# Patient Record
Sex: Male | Born: 1990
Health system: Southern US, Community
[De-identification: ages and names within clinical notes are randomized; demographics above are authoritative.]

## PROBLEM LIST (undated history)

## (undated) DIAGNOSIS — F32A Depression, unspecified: Secondary | ICD-10-CM

## (undated) DIAGNOSIS — I10 Essential (primary) hypertension: Secondary | ICD-10-CM

## (undated) DIAGNOSIS — F419 Anxiety disorder, unspecified: Secondary | ICD-10-CM

## (undated) DIAGNOSIS — F329 Major depressive disorder, single episode, unspecified: Secondary | ICD-10-CM

## (undated) HISTORY — DX: Depression, unspecified: F32.A

## (undated) HISTORY — DX: Anxiety disorder, unspecified: F41.9

## (undated) HISTORY — DX: Major depressive disorder, single episode, unspecified: F32.9

---
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
## 2016-11-11 DIAGNOSIS — E785 Hyperlipidemia, unspecified: Secondary | ICD-10-CM | POA: Diagnosis not present

## 2016-11-11 DIAGNOSIS — R197 Diarrhea, unspecified: Secondary | ICD-10-CM | POA: Diagnosis not present

## 2016-11-11 DIAGNOSIS — Z23 Encounter for immunization: Secondary | ICD-10-CM | POA: Diagnosis not present

## 2016-11-11 DIAGNOSIS — I10 Essential (primary) hypertension: Secondary | ICD-10-CM | POA: Diagnosis not present

## 2016-11-11 DIAGNOSIS — E039 Hypothyroidism, unspecified: Secondary | ICD-10-CM | POA: Diagnosis not present

## 2016-11-11 DIAGNOSIS — F410 Panic disorder [episodic paroxysmal anxiety] without agoraphobia: Secondary | ICD-10-CM | POA: Diagnosis not present

## 2016-11-11 DIAGNOSIS — Z79899 Other long term (current) drug therapy: Secondary | ICD-10-CM | POA: Diagnosis not present

## 2016-12-02 DIAGNOSIS — R5381 Other malaise: Secondary | ICD-10-CM | POA: Diagnosis not present

## 2016-12-02 DIAGNOSIS — E039 Hypothyroidism, unspecified: Secondary | ICD-10-CM | POA: Diagnosis not present

## 2016-12-02 DIAGNOSIS — R5383 Other fatigue: Secondary | ICD-10-CM | POA: Diagnosis not present

## 2016-12-06 DIAGNOSIS — E039 Hypothyroidism, unspecified: Secondary | ICD-10-CM | POA: Diagnosis not present

## 2016-12-06 DIAGNOSIS — R5381 Other malaise: Secondary | ICD-10-CM | POA: Diagnosis not present

## 2016-12-06 DIAGNOSIS — R0602 Shortness of breath: Secondary | ICD-10-CM | POA: Diagnosis not present

## 2016-12-06 DIAGNOSIS — R5383 Other fatigue: Secondary | ICD-10-CM | POA: Diagnosis not present

## 2016-12-09 DIAGNOSIS — F410 Panic disorder [episodic paroxysmal anxiety] without agoraphobia: Secondary | ICD-10-CM | POA: Diagnosis not present

## 2016-12-09 DIAGNOSIS — F332 Major depressive disorder, recurrent severe without psychotic features: Secondary | ICD-10-CM | POA: Diagnosis not present

## 2017-01-09 DIAGNOSIS — H1032 Unspecified acute conjunctivitis, left eye: Secondary | ICD-10-CM | POA: Diagnosis not present

## 2017-02-05 DIAGNOSIS — R0602 Shortness of breath: Secondary | ICD-10-CM | POA: Diagnosis not present

## 2017-02-05 DIAGNOSIS — F419 Anxiety disorder, unspecified: Secondary | ICD-10-CM | POA: Diagnosis not present

## 2017-02-24 DIAGNOSIS — F410 Panic disorder [episodic paroxysmal anxiety] without agoraphobia: Secondary | ICD-10-CM | POA: Diagnosis not present

## 2017-02-24 DIAGNOSIS — F339 Major depressive disorder, recurrent, unspecified: Secondary | ICD-10-CM | POA: Diagnosis not present

## 2017-02-24 DIAGNOSIS — E039 Hypothyroidism, unspecified: Secondary | ICD-10-CM | POA: Diagnosis not present

## 2017-03-10 DIAGNOSIS — R1110 Vomiting, unspecified: Secondary | ICD-10-CM | POA: Diagnosis not present

## 2017-03-10 DIAGNOSIS — R197 Diarrhea, unspecified: Secondary | ICD-10-CM | POA: Diagnosis not present

## 2017-03-24 DIAGNOSIS — Z1389 Encounter for screening for other disorder: Secondary | ICD-10-CM | POA: Diagnosis not present

## 2017-03-24 DIAGNOSIS — F339 Major depressive disorder, recurrent, unspecified: Secondary | ICD-10-CM | POA: Diagnosis not present

## 2017-03-24 DIAGNOSIS — F410 Panic disorder [episodic paroxysmal anxiety] without agoraphobia: Secondary | ICD-10-CM | POA: Diagnosis not present

## 2017-03-24 DIAGNOSIS — Z79899 Other long term (current) drug therapy: Secondary | ICD-10-CM | POA: Diagnosis not present

## 2017-06-18 DIAGNOSIS — E039 Hypothyroidism, unspecified: Secondary | ICD-10-CM | POA: Diagnosis not present

## 2017-06-18 DIAGNOSIS — E669 Obesity, unspecified: Secondary | ICD-10-CM | POA: Diagnosis not present

## 2017-06-18 DIAGNOSIS — Z0000 Encounter for general adult medical examination without abnormal findings: Secondary | ICD-10-CM | POA: Diagnosis not present

## 2017-06-18 DIAGNOSIS — Z1389 Encounter for screening for other disorder: Secondary | ICD-10-CM | POA: Diagnosis not present

## 2017-08-21 DIAGNOSIS — F332 Major depressive disorder, recurrent severe without psychotic features: Secondary | ICD-10-CM | POA: Diagnosis not present

## 2017-08-28 DIAGNOSIS — F332 Major depressive disorder, recurrent severe without psychotic features: Secondary | ICD-10-CM | POA: Diagnosis not present

## 2017-08-29 DIAGNOSIS — F332 Major depressive disorder, recurrent severe without psychotic features: Secondary | ICD-10-CM | POA: Diagnosis not present

## 2017-09-01 DIAGNOSIS — F332 Major depressive disorder, recurrent severe without psychotic features: Secondary | ICD-10-CM | POA: Diagnosis not present

## 2017-09-02 DIAGNOSIS — F332 Major depressive disorder, recurrent severe without psychotic features: Secondary | ICD-10-CM | POA: Diagnosis not present

## 2017-09-03 DIAGNOSIS — F332 Major depressive disorder, recurrent severe without psychotic features: Secondary | ICD-10-CM | POA: Diagnosis not present

## 2017-09-04 DIAGNOSIS — F332 Major depressive disorder, recurrent severe without psychotic features: Secondary | ICD-10-CM | POA: Diagnosis not present

## 2017-09-05 DIAGNOSIS — F332 Major depressive disorder, recurrent severe without psychotic features: Secondary | ICD-10-CM | POA: Diagnosis not present

## 2017-09-08 DIAGNOSIS — F332 Major depressive disorder, recurrent severe without psychotic features: Secondary | ICD-10-CM | POA: Diagnosis not present

## 2017-09-09 DIAGNOSIS — F332 Major depressive disorder, recurrent severe without psychotic features: Secondary | ICD-10-CM | POA: Diagnosis not present

## 2017-09-10 DIAGNOSIS — F332 Major depressive disorder, recurrent severe without psychotic features: Secondary | ICD-10-CM | POA: Diagnosis not present

## 2017-09-12 DIAGNOSIS — F332 Major depressive disorder, recurrent severe without psychotic features: Secondary | ICD-10-CM | POA: Diagnosis not present

## 2017-09-15 DIAGNOSIS — F332 Major depressive disorder, recurrent severe without psychotic features: Secondary | ICD-10-CM | POA: Diagnosis not present

## 2017-09-16 DIAGNOSIS — F332 Major depressive disorder, recurrent severe without psychotic features: Secondary | ICD-10-CM | POA: Diagnosis not present

## 2017-09-17 DIAGNOSIS — F332 Major depressive disorder, recurrent severe without psychotic features: Secondary | ICD-10-CM | POA: Diagnosis not present

## 2017-09-18 DIAGNOSIS — F332 Major depressive disorder, recurrent severe without psychotic features: Secondary | ICD-10-CM | POA: Diagnosis not present

## 2017-09-19 DIAGNOSIS — F332 Major depressive disorder, recurrent severe without psychotic features: Secondary | ICD-10-CM | POA: Diagnosis not present

## 2017-09-22 DIAGNOSIS — F332 Major depressive disorder, recurrent severe without psychotic features: Secondary | ICD-10-CM | POA: Diagnosis not present

## 2017-09-23 DIAGNOSIS — F332 Major depressive disorder, recurrent severe without psychotic features: Secondary | ICD-10-CM | POA: Diagnosis not present

## 2017-09-24 DIAGNOSIS — F332 Major depressive disorder, recurrent severe without psychotic features: Secondary | ICD-10-CM | POA: Diagnosis not present

## 2017-09-25 DIAGNOSIS — F332 Major depressive disorder, recurrent severe without psychotic features: Secondary | ICD-10-CM | POA: Diagnosis not present

## 2017-09-26 DIAGNOSIS — F332 Major depressive disorder, recurrent severe without psychotic features: Secondary | ICD-10-CM | POA: Diagnosis not present

## 2017-09-30 DIAGNOSIS — F332 Major depressive disorder, recurrent severe without psychotic features: Secondary | ICD-10-CM | POA: Diagnosis not present

## 2017-10-01 DIAGNOSIS — F332 Major depressive disorder, recurrent severe without psychotic features: Secondary | ICD-10-CM | POA: Diagnosis not present

## 2017-10-02 DIAGNOSIS — F332 Major depressive disorder, recurrent severe without psychotic features: Secondary | ICD-10-CM | POA: Diagnosis not present

## 2017-10-04 DIAGNOSIS — F332 Major depressive disorder, recurrent severe without psychotic features: Secondary | ICD-10-CM | POA: Diagnosis not present

## 2017-10-09 DIAGNOSIS — F332 Major depressive disorder, recurrent severe without psychotic features: Secondary | ICD-10-CM | POA: Diagnosis not present

## 2017-12-09 DIAGNOSIS — E039 Hypothyroidism, unspecified: Secondary | ICD-10-CM | POA: Diagnosis not present

## 2017-12-09 DIAGNOSIS — H6120 Impacted cerumen, unspecified ear: Secondary | ICD-10-CM | POA: Diagnosis not present

## 2017-12-09 DIAGNOSIS — H6591 Unspecified nonsuppurative otitis media, right ear: Secondary | ICD-10-CM | POA: Diagnosis not present

## 2017-12-22 DIAGNOSIS — F332 Major depressive disorder, recurrent severe without psychotic features: Secondary | ICD-10-CM | POA: Diagnosis not present

## 2017-12-22 DIAGNOSIS — E039 Hypothyroidism, unspecified: Secondary | ICD-10-CM | POA: Diagnosis not present

## 2017-12-22 DIAGNOSIS — Z1331 Encounter for screening for depression: Secondary | ICD-10-CM | POA: Diagnosis not present

## 2018-12-15 DIAGNOSIS — F329 Major depressive disorder, single episode, unspecified: Secondary | ICD-10-CM | POA: Diagnosis not present

## 2019-01-14 ENCOUNTER — Other Ambulatory Visit: Payer: Self-pay

## 2019-01-14 ENCOUNTER — Encounter: Payer: Self-pay | Admitting: Psychiatry

## 2019-01-14 ENCOUNTER — Ambulatory Visit (INDEPENDENT_AMBULATORY_CARE_PROVIDER_SITE_OTHER): Payer: BLUE CROSS/BLUE SHIELD | Admitting: Psychiatry

## 2019-01-14 DIAGNOSIS — F332 Major depressive disorder, recurrent severe without psychotic features: Secondary | ICD-10-CM

## 2019-01-14 NOTE — Progress Notes (Signed)
ECT: This is a ECT consult for this 28 year old self-referred man.  Patient seen chart reviewed.  No old records available.  Patient reports that he suffers from depression.  Tells me that he has been feeling depressed for many years now.  First got treatment when he was in high school but thinks he was depressed even before that.  Mood feels down and sad all the time.  Difficulty concentrating.  Difficulty functioning at work.  Tired much of the time.  Impaired sleep normal appetite.  Patient says that he feels suicidal all the time but has no formed plan or intention of acting on it.  Denies hallucinations or psychotic symptoms.  Patient is currently not taking any psychiatric medicine.  He recently completed a course of transcranial magnetic stimulation without receiving any benefit in his opinion.  Patient does not drink alcohol or use any drugs.  Lives with his grandparents.  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  Not currently seeing a psychiatrist.  Has been getting treatment through his primary care doctor at times.  Most recent medicine was Viibryd which she did not find effective.  Social history: Patient lives with his grandparents.  Works and goes to [employer].  Not involved in a relationship.  Sounds like he has a pretty lonely life outside of work and school.  Medical history: Hypothyroid.  Currently on 100 mcg of levothyroxine.  Never had general anesthesia.  Substance abuse history: Denies alcohol or drug abuse  Mental status exam: Patient is neatly dressed and groomed.  Cooperative with the interview.  Eye contact normal.  Psychomotor activity normal.  Speech normal rate tone and volume.  Affect is flat and blunted.  No tearfulness.  Mood is stated as depressed.  Thoughts appear to be lucid without any sign of delusional thinking.  Denies hallucinations.  Passive chronic suicidal ideation without intent or plan.  No homicidal ideation.  Patient is alert and  oriented and cognitively intact and able to understand the discussed treatment plan.  Past psychiatric history: No previous psychiatric hospitalization.  He says he has been on at least 11 different antidepressant medicines and never found them to be significantly helpful.  He denies any history consistent with mania.  Denies any psychosis.  Never actually tried to kill himself in the past.  Family history: Positive for substance abuse in his mother  Assessment: 66 year old man with major depression severe recurrent without psychotic features.  He is requesting ECT himself.  Based on diagnosis and history that he is presenting he would be a reasonable candidate.  No contraindications present to prevent ECT.  Patient however is not currently seeing an outpatient provider which is somewhat concerning to me.  Not on any medication.  Does not appear to be acutely dangerous or to require hospitalization.  Spent time explaining and discussing ECT.  Patient was given the opportunity to ask questions as much as he wanted.  He expressed an understanding and states that he would like to proceed with ECT.  We are currently not starting any new treatment until the month of May at least because of the coronavirus situation.  Patient does not want to start until he finishes the college semester after the first week of May anyway.  We are tentatively going to plan for May 8.  Prescription done to get all the basic lab studies done.  I will send an email to nursing and utilization review with a tentative plan for May  8.  Patient states that he will agree to go and try and at least get in to see a therapist sooner rather than later.

## 2019-01-25 ENCOUNTER — Ambulatory Visit: Payer: Self-pay | Admitting: Psychiatry

## 2019-03-24 ENCOUNTER — Ambulatory Visit
Admission: RE | Admit: 2019-03-24 | Discharge: 2019-03-24 | Disposition: A | Discharge status: Home / Self Care | Payer: BC Managed Care – PPO | Source: Ambulatory Visit | Attending: Psychiatry | Admitting: Psychiatry

## 2019-03-24 ENCOUNTER — Other Ambulatory Visit: Payer: Self-pay

## 2019-03-24 ENCOUNTER — Encounter
Admission: RE | Admit: 2019-03-24 | Discharge: 2019-03-24 | Disposition: A | Discharge status: Home / Self Care | Payer: BC Managed Care – PPO | Source: Ambulatory Visit | Attending: Psychiatry | Admitting: Psychiatry

## 2019-03-24 DIAGNOSIS — Z01818 Encounter for other preprocedural examination: Secondary | ICD-10-CM | POA: Insufficient documentation

## 2019-03-24 DIAGNOSIS — Z01810 Encounter for preprocedural cardiovascular examination: Secondary | ICD-10-CM | POA: Diagnosis not present

## 2019-03-24 LAB — BASIC METABOLIC PANEL
Anion gap: 9 (ref 5–15)
BUN: 12 mg/dL (ref 6–20)
CO2: 24 mmol/L (ref 22–32)
Calcium: 9 mg/dL (ref 8.9–10.3)
Chloride: 105 mmol/L (ref 98–111)
Creatinine, Ser: 1.09 mg/dL (ref 0.61–1.24)
GFR calc Af Amer: >60 mL/min (ref >60)
GFR calc non Af Amer: >60 mL/min (ref >60)
Glucose, Bld: 83 mg/dL (ref 70–99)
Potassium: 4.1 mmol/L (ref 3.5–5.1)
Sodium: 138 mmol/L (ref 135–145)

## 2019-03-24 LAB — CBC
HCT: 46.4 % (ref 39.0–52.0)
Hemoglobin: 15.4 g/dL (ref 13.0–17.0)
MCH: 28.5 pg (ref 26.0–34.0)
MCHC: 33.2 g/dL (ref 30.0–36.0)
MCV: 85.9 fL (ref 80.0–100.0)
Platelets: 258 10*3/uL (ref 150–400)
RBC: 5.4 MIL/uL (ref 4.22–5.81)
RDW: 12.4 % (ref 11.5–15.5)
WBC: 7.5 10*3/uL (ref 4.0–10.5)
nRBC: 0 % (ref 0.0–0.2)

## 2019-03-29 ENCOUNTER — Other Ambulatory Visit: Payer: Self-pay | Admitting: Psychiatry

## 2019-03-29 ENCOUNTER — Other Ambulatory Visit: Payer: Self-pay

## 2019-03-29 ENCOUNTER — Encounter
Admission: RE | Admit: 2019-03-29 | Discharge: 2019-03-29 | Disposition: A | Discharge status: Home / Self Care | Payer: BC Managed Care – PPO | Source: Ambulatory Visit | Attending: Psychiatry | Admitting: Psychiatry

## 2019-03-29 ENCOUNTER — Other Ambulatory Visit
Admission: RE | Admit: 2019-03-29 | Discharge: 2019-03-29 | Disposition: A | Discharge status: Home / Self Care | Payer: BC Managed Care – PPO | Source: Ambulatory Visit | Attending: Psychiatry | Admitting: Psychiatry

## 2019-03-29 ENCOUNTER — Encounter: Payer: Self-pay | Admitting: Anesthesiology

## 2019-03-29 DIAGNOSIS — F329 Major depressive disorder, single episode, unspecified: Secondary | ICD-10-CM | POA: Diagnosis not present

## 2019-03-29 DIAGNOSIS — M791 Myalgia, unspecified site: Secondary | ICD-10-CM | POA: Diagnosis not present

## 2019-03-29 DIAGNOSIS — Z1159 Encounter for screening for other viral diseases: Secondary | ICD-10-CM | POA: Insufficient documentation

## 2019-03-29 DIAGNOSIS — I10 Essential (primary) hypertension: Secondary | ICD-10-CM | POA: Insufficient documentation

## 2019-03-29 DIAGNOSIS — F332 Major depressive disorder, recurrent severe without psychotic features: Secondary | ICD-10-CM

## 2019-03-29 DIAGNOSIS — F419 Anxiety disorder, unspecified: Secondary | ICD-10-CM | POA: Diagnosis not present

## 2019-03-29 HISTORY — DX: Essential (primary) hypertension: I10

## 2019-03-29 LAB — SARS CORONAVIRUS 2 BY RT PCR (HOSPITAL ORDER, PERFORMED IN ~~LOC~~ HOSPITAL LAB): SARS Coronavirus 2: NEGATIVE

## 2019-03-29 MED ORDER — SUCCINYLCHOLINE CHLORIDE 200 MG/10ML IV SOSY
PREFILLED_SYRINGE | INTRAVENOUS | Status: DC | PRN
  Administered 2019-03-29: 150 mg via INTRAVENOUS

## 2019-03-29 MED ORDER — MIDAZOLAM HCL 2 MG/2ML IJ SOLN
INTRAMUSCULAR | Status: DC | PRN
  Administered 2019-03-29 (×2): 2 mg via INTRAVENOUS

## 2019-03-29 MED ORDER — ONDANSETRON HCL 4 MG/2ML IJ SOLN: 4.0000 mg | Freq: Once | INTRAMUSCULAR | Status: DC | PRN

## 2019-03-29 MED ORDER — SODIUM CHLORIDE 0.9 % IV SOLN
500.0000 mL | Freq: Once | INTRAVENOUS | Status: AC
  Administered 2019-03-29: 500 mL via INTRAVENOUS

## 2019-03-29 MED ORDER — FENTANYL CITRATE (PF) 100 MCG/2ML IJ SOLN: 25.0000 ug | INTRAMUSCULAR | Status: DC | PRN

## 2019-03-29 MED ORDER — SUCCINYLCHOLINE CHLORIDE 20 MG/ML IJ SOLN
INTRAMUSCULAR | Status: AC
  Filled 2019-03-29: qty 1

## 2019-03-29 MED ORDER — METHOHEXITAL SODIUM 100 MG/10ML IV SOSY
PREFILLED_SYRINGE | INTRAVENOUS | Status: DC | PRN
  Administered 2019-03-29: 150 mg via INTRAVENOUS

## 2019-03-29 MED ORDER — SODIUM CHLORIDE 0.9 % IV SOLN
INTRAVENOUS | Status: DC | PRN
  Administered 2019-03-29: 10:00:00 via INTRAVENOUS

## 2019-03-29 MED ORDER — MIDAZOLAM HCL 2 MG/2ML IJ SOLN
INTRAMUSCULAR | Status: AC
  Filled 2019-03-29: qty 2

## 2019-03-29 MED ORDER — METHOHEXITAL SODIUM 0.5 G IJ SOLR
INTRAMUSCULAR | Status: AC
  Filled 2019-03-29: qty 500

## 2019-03-29 NOTE — Anesthesia Post-op Follow-up Note (Signed)
Anesthesia QCDR form completed.        

## 2019-03-29 NOTE — Anesthesia Procedure Notes (Signed)
Date/Time: 03/29/2019 10:33 AM Performed by: Dionne Bucy, CRNA Pre-anesthesia Checklist: Patient identified, Emergency Drugs available, Suction available and Patient being monitored Patient Re-evaluated:Patient Re-evaluated prior to induction Oxygen Delivery Method: Circle system utilized Preoxygenation: Pre-oxygenation with 100% oxygen Induction Type: IV induction Ventilation: Mask ventilation without difficulty and Mask ventilation throughout procedure Airway Equipment and Method: Bite block Placement Confirmation: positive ETCO2 Dental Injury: Teeth and Oropharynx as per pre-operative assessment

## 2019-03-29 NOTE — Anesthesia Preprocedure Evaluation (Signed)
Anesthesia Evaluation  Patient identified by MRN, date of birth, ID band Patient awake    Reviewed: Allergy & Precautions, H&P , NPO status , Patient's Chart, lab work & pertinent test results, reviewed documented beta blocker date and time   Airway Mallampati: II   Neck ROM: full    Dental  (+) Poor Dentition   Pulmonary neg pulmonary ROS,    Pulmonary exam normal        Cardiovascular Exercise Tolerance: Good hypertension, On Medications negative cardio ROS Normal cardiovascular exam Rhythm:regular Rate:Normal     Neuro/Psych PSYCHIATRIC DISORDERS Anxiety Depression negative neurological ROS  negative psych ROS   GI/Hepatic negative GI ROS, Neg liver ROS,   Endo/Other  negative endocrine ROS  Renal/GU negative Renal ROS  negative genitourinary   Musculoskeletal   Abdominal   Peds  Hematology negative hematology ROS (+)   Anesthesia Other Findings Past Medical History: No date: Anxiety No date: Depression No date: Hypertension No past surgical history on file. BMI    Body Mass Index:  40.89 kg/m     Reproductive/Obstetrics negative OB ROS                             Anesthesia Physical Anesthesia Plan  ASA: III  Anesthesia Plan: General   Post-op Pain Management:    Induction:   PONV Risk Score and Plan:   Airway Management Planned:   Additional Equipment:   Intra-op Plan:   Post-operative Plan:   Informed Consent: I have reviewed the patients History and Physical, chart, labs and discussed the procedure including the risks, benefits and alternatives for the proposed anesthesia with the patient or authorized representative who has indicated his/her understanding and acceptance.     Dental Advisory Given  Plan Discussed with: CRNA  Anesthesia Plan Comments:         Anesthesia Quick Evaluation

## 2019-03-29 NOTE — Discharge Instructions (Signed)
1)  The drugs that you have been given will stay in your system until tomorrow so for the       next 24 hours you should not:  A. Drive an automobile  B. Make any legal decisions  C. Drink any alcoholic beverages  2)  You may resume your regular meals upon return home.  3)  A responsible adult must take you home.  Someone should stay with you for a few          hours, then be available by phone for the remainder of the treatment day.  4)  You May experience any of the following symptoms:  Headache, Nausea and a dry mouth (due to the medications you were given),  temporary memory loss and some confusion, or sore muscles (a warm bath  should help this).  If you you experience any of these symptoms let us know on                your return visit.  5)  Report any of the following: any acute discomfort, severe headache, or temperature        greater than 100.5 F.   Also report any unusual redness, swelling, drainage, or pain         at your IV site.    You may report Symptoms to:  [place] [facility]          Phone: [phone], ECT Department           or Dr. Prescott Gum office [phone]  6)  Your next ECT Treatment is Wednesday June 10   We will call 2 days prior to your scheduled appointment for arrival times.  7)  Nothing to eat or drink after midnight the night before your procedure.  8)  Take    With a sip of water the morning of your procedure.  9)  Other Instructions: Call [phone] to cancel the morning of your procedure due         to illness or emergency.  10) We will call within 72 hours to assess how you are feeling.

## 2019-03-29 NOTE — Procedures (Signed)
ECT SERVICES Physician's Interval Evaluation & Treatment Note  Patient Identification: Kenneth Morgan MRN:  [id number] Date of Evaluation:  03/29/2019 TX #: 1  MADRS: 38  MMSE: 30  P.E. Findings:  Healthy no heart or lung problems vitals unremarkable  Psychiatric Interval Note:  Depressed without psychotic symptoms  Subjective:  Patient is a 28 y.o. male seen for evaluation for Electroconvulsive Therapy. Depressed passive suicidal thoughts without any intent or plan  Treatment Summary:   [x]   Right Unilateral             []  Bilateral   % Energy : 0.3 ms 30%   Impedance: 1920 ohms  Seizure Energy Index: 11,887 V squared  Postictal Suppression Index: 80%  Seizure Concordance Index: 97%  Medications  Pre Shock: Brevital 150 mg succinylcholine 150 mg  Post Shock: Versed 2 mg  Seizure Duration: 26 seconds EMG 76 seconds EEG   Comments: We will make a couple of changes by adding Robinul 0.2 mg before the treatment and also increasing dose of succinylcholine to 200 mg.  Follow-up on Wednesday  Lungs:  [x]   Clear to auscultation               []  Other:   Heart:    [x]   Regular rhythm             []  irregular rhythm    [x]   Previous H&P reviewed, patient examined and there are NO CHANGES                 []   Previous H&P reviewed, patient examined and there are changes noted.   Alethia Berthold, MD 6/8/202010:12 AM

## 2019-03-29 NOTE — Anesthesia Postprocedure Evaluation (Signed)
Anesthesia Post Note  Patient: Kenneth Morgan  Procedure(s) Performed: ECT TX  Patient location during evaluation: PACU Anesthesia Type: General Level of consciousness: awake and alert Pain management: pain level controlled Vital Signs Assessment: post-procedure vital signs reviewed and stable Respiratory status: spontaneous breathing, nonlabored ventilation, respiratory function stable and patient connected to nasal cannula oxygen Cardiovascular status: blood pressure returned to baseline and stable Postop Assessment: no apparent nausea or vomiting Anesthetic complications: no     Last Vitals:  Vitals:   03/29/19 1103 03/29/19 1113  BP: (!) 144/92 (!) 146/87  Pulse: 90 92  Resp: 15 14  Temp:  37.2 C  SpO2: 97% 100%    Last Pain:  Vitals:   03/29/19 0818  PainSc: 0-No pain                 Molli Barrows

## 2019-03-29 NOTE — Transfer of Care (Signed)
Immediate Anesthesia Transfer of Care Note  Patient: Kenneth Morgan  Procedure(s) Performed: ECT TX  Patient Location: PACU  Anesthesia Type:General  Level of Consciousness: sedated  Airway & Oxygen Therapy: Patient Spontanous Breathing and Patient connected to face mask oxygen  Post-op Assessment: Report given to RN and Post -op Vital signs reviewed and stable  Post vital signs: Reviewed and stable  Last Vitals:  Vitals Value Taken Time  BP    Temp    Pulse 121 03/29/2019 10:46 AM  Resp 22 03/29/2019 10:46 AM  SpO2 100 % 03/29/2019 10:46 AM  Vitals shown include unvalidated device data.  Last Pain:  Vitals:   03/29/19 0818  PainSc: 0-No pain         Complications: No apparent anesthesia complications

## 2019-03-29 NOTE — H&P (Signed)
Kenneth Morgan is an 28 y.o. male.   Chief Complaint: Major depression with hopelessness and suicidal ideation  HPI: recurrent depression  Past Medical History:  Diagnosis Date  . Anxiety   . Depression   . Hypertension     No past surgical history on file.  Family History  Problem Relation Age of Onset  . Drug abuse Mother   . Depression Mother   . Anxiety disorder Mother   . Bipolar disorder Mother   . Anxiety disorder Sister   . Depression Sister    Social History:  reports that he has never smoked. He has never used smokeless tobacco. He reports previous alcohol use. He reports previous drug use. Drug: Marijuana.  Allergies: No Known Allergies  (Not in a hospital admission)   No results found for this or any previous visit (from the past 48 hour(s)). No results found.  Review of Systems  Constitutional: Negative.   HENT: Negative.   Eyes: Negative.   Respiratory: Negative.   Cardiovascular: Negative.   Gastrointestinal: Negative.   Musculoskeletal: Negative.   Skin: Negative.   Neurological: Negative.   Psychiatric/Behavioral: Positive for depression and suicidal ideas. Negative for hallucinations, memory loss and substance abuse. The patient is nervous/anxious.     Blood pressure (!) 181/96, pulse 82, temperature 97.6 F (36.4 C), resp. rate 16, height 5\' 10"  (1.778 m), weight 129.3 kg, SpO2 100 %. Physical Exam  Nursing note and vitals reviewed. Constitutional: He appears well-developed and well-nourished.  HENT:  Head: Normocephalic and atraumatic.  Eyes: Pupils are equal, round, and reactive to light. Conjunctivae are normal.  Neck: Normal range of motion.  Cardiovascular: Regular rhythm and normal heart sounds.  Respiratory: Effort normal. No respiratory distress.  GI: Soft.  Musculoskeletal: Normal range of motion.  Neurological: He is alert.  Skin: Skin is warm and dry.  Psychiatric: Judgment normal. His speech is delayed. He is slowed. He  exhibits a depressed mood. He expresses suicidal ideation. He expresses no suicidal plans.     Assessment/Plan Start rul ECT today cont index course as outpt. PAtient able to agree to not having suicidal intent or plan and to keep [place] informed as that changes  Alethia Berthold, MD 03/29/2019, 9:59 AM

## 2019-03-30 ENCOUNTER — Other Ambulatory Visit: Payer: Self-pay | Admitting: Psychiatry

## 2019-03-31 ENCOUNTER — Encounter: Payer: Self-pay | Admitting: Anesthesiology

## 2019-03-31 ENCOUNTER — Other Ambulatory Visit: Payer: Self-pay

## 2019-03-31 ENCOUNTER — Encounter (HOSPITAL_BASED_OUTPATIENT_CLINIC_OR_DEPARTMENT_OTHER)
Admission: RE | Admit: 2019-03-31 | Discharge: 2019-03-31 | Disposition: A | Discharge status: Home / Self Care | Payer: BC Managed Care – PPO | Source: Ambulatory Visit | Attending: Psychiatry | Admitting: Psychiatry

## 2019-03-31 DIAGNOSIS — F419 Anxiety disorder, unspecified: Secondary | ICD-10-CM | POA: Diagnosis not present

## 2019-03-31 DIAGNOSIS — F329 Major depressive disorder, single episode, unspecified: Secondary | ICD-10-CM | POA: Diagnosis not present

## 2019-03-31 DIAGNOSIS — F332 Major depressive disorder, recurrent severe without psychotic features: Secondary | ICD-10-CM | POA: Diagnosis not present

## 2019-03-31 DIAGNOSIS — I10 Essential (primary) hypertension: Secondary | ICD-10-CM | POA: Diagnosis not present

## 2019-03-31 DIAGNOSIS — Z1159 Encounter for screening for other viral diseases: Secondary | ICD-10-CM | POA: Diagnosis not present

## 2019-03-31 DIAGNOSIS — M791 Myalgia, unspecified site: Secondary | ICD-10-CM | POA: Diagnosis not present

## 2019-03-31 MED ORDER — MIDAZOLAM HCL 2 MG/2ML IJ SOLN: 4.0000 mg | Freq: Once | INTRAMUSCULAR | Status: DC

## 2019-03-31 MED ORDER — METHOHEXITAL SODIUM 100 MG/10ML IV SOSY
PREFILLED_SYRINGE | INTRAVENOUS | Status: DC | PRN
  Administered 2019-03-31: 150 mg via INTRAVENOUS

## 2019-03-31 MED ORDER — GLYCOPYRROLATE 0.2 MG/ML IJ SOLN
0.2000 mg | Freq: Once | INTRAMUSCULAR | Status: AC
  Administered 2019-03-31: 0.2 mg via INTRAVENOUS

## 2019-03-31 MED ORDER — GLYCOPYRROLATE 0.2 MG/ML IJ SOLN
INTRAMUSCULAR | Status: AC
  Filled 2019-03-31: qty 1

## 2019-03-31 MED ORDER — MIDAZOLAM HCL 2 MG/2ML IJ SOLN
INTRAMUSCULAR | Status: DC | PRN
  Administered 2019-03-31: 4 mg via INTRAVENOUS

## 2019-03-31 MED ORDER — SUCCINYLCHOLINE CHLORIDE 200 MG/10ML IV SOSY
PREFILLED_SYRINGE | INTRAVENOUS | Status: DC | PRN
  Administered 2019-03-31: 200 mg via INTRAVENOUS

## 2019-03-31 MED ORDER — SUCCINYLCHOLINE CHLORIDE 20 MG/ML IJ SOLN
INTRAMUSCULAR | Status: AC
  Filled 2019-03-31: qty 1

## 2019-03-31 MED ORDER — MIDAZOLAM HCL 2 MG/2ML IJ SOLN
INTRAMUSCULAR | Status: AC
  Filled 2019-03-31: qty 4

## 2019-03-31 MED ORDER — SODIUM CHLORIDE 0.9 % IV SOLN
500.0000 mL | Freq: Once | INTRAVENOUS | Status: AC
  Administered 2019-03-31: 500 mL via INTRAVENOUS

## 2019-03-31 MED ORDER — SODIUM CHLORIDE 0.9 % IV SOLN
INTRAVENOUS | Status: DC | PRN
  Administered 2019-03-31: 11:00:00 via INTRAVENOUS

## 2019-03-31 NOTE — Anesthesia Procedure Notes (Signed)
Date/Time: 03/31/2019 10:44 AM Performed by: Dionne Bucy, CRNA Pre-anesthesia Checklist: Patient identified, Emergency Drugs available, Suction available and Patient being monitored Patient Re-evaluated:Patient Re-evaluated prior to induction Oxygen Delivery Method: Circle system utilized Preoxygenation: Pre-oxygenation with 100% oxygen Induction Type: IV induction Ventilation: Mask ventilation without difficulty and Mask ventilation throughout procedure Airway Equipment and Method: Bite block Placement Confirmation: positive ETCO2 Dental Injury: Teeth and Oropharynx as per pre-operative assessment

## 2019-03-31 NOTE — Anesthesia Postprocedure Evaluation (Signed)
Anesthesia Post Note  Patient: Kenneth Morgan  Procedure(s) Performed: ECT TX  Patient location during evaluation: PACU Anesthesia Type: General Level of consciousness: awake and alert and oriented Pain management: pain level controlled Vital Signs Assessment: post-procedure vital signs reviewed and stable Respiratory status: spontaneous breathing, nonlabored ventilation and respiratory function stable Cardiovascular status: blood pressure returned to baseline and stable Postop Assessment: no signs of nausea or vomiting Anesthetic complications: no     Last Vitals:  Vitals:   03/31/19 1112 03/31/19 1115  BP:  (!) 129/96  Pulse: (!) 105 99  Resp: 20 (!) 23  Temp:    SpO2: 98% 97%    Last Pain:  Vitals:   03/31/19 1115  TempSrc:   PainSc: 0-No pain                 [name]

## 2019-03-31 NOTE — Procedures (Signed)
ECT SERVICES Physician's Interval Evaluation & Treatment Note  Patient Identification: Kenneth Morgan MRN:  [id number] Date of Evaluation:  03/31/2019 TX #: 2  MADRS:   MMSE:   P.E. Findings:  No change to physical exam.  Heart and lungs normal.  No sign of any medical problem.  Vitals unremarkable.  Psychiatric Interval Note:  Patient reports he is feeling a little bit better.  Had some aches and pains after last treatment.  No acute suicidal intent.  Subjective:  Patient is a 28 y.o. male seen for evaluation for Electroconvulsive Therapy. Slightly better.  Optimistic  Treatment Summary:   [x]   Right Unilateral             []  Bilateral   % Energy : 0.3 ms 30%   Impedance: 930 ohms  Seizure Energy Index: 5785 V squared  Postictal Suppression Index: 27%  Seizure Concordance Index: 96%  Medications  Pre Shock: Robinul 0.2 mg Brevital 150 mg succinylcholine 200 mg  Post Shock: Versed 4 mg  Seizure Duration: 20 seconds EMG 45 seconds EEG   Comments: Tolerating treatment well.  Plan to continue with index course next treatment on Friday  Lungs:  [x]   Clear to auscultation               []  Other:   Heart:    [x]   Regular rhythm             []  irregular rhythm    [x]   Previous H&P reviewed, patient examined and there are NO CHANGES                 []   Previous H&P reviewed, patient examined and there are changes noted.   Alethia Berthold, MD 6/10/20205:48 PM

## 2019-03-31 NOTE — Transfer of Care (Signed)
Immediate Anesthesia Transfer of Care Note  Patient: Kenneth Morgan  Procedure(s) Performed: ECT TX  Patient Location: PACU  Anesthesia Type:General  Level of Consciousness: sedated  Airway & Oxygen Therapy: Patient Spontanous Breathing and Patient connected to face mask oxygen  Post-op Assessment: Report given to RN and Post -op Vital signs reviewed and stable  Post vital signs: Reviewed and stable  Last Vitals:  Vitals Value Taken Time  BP 136/87 03/31/2019 10:55 AM  Temp 37.1 C 03/31/2019 10:54 AM  Pulse 93 03/31/2019 10:57 AM  Resp 26 03/31/2019 10:57 AM  SpO2 100 % 03/31/2019 10:57 AM  Vitals shown include unvalidated device data.  Last Pain:  Vitals:   03/31/19 1054  TempSrc:   PainSc: Asleep         Complications: No apparent anesthesia complications

## 2019-03-31 NOTE — Anesthesia Post-op Follow-up Note (Signed)
Anesthesia QCDR form completed.        

## 2019-03-31 NOTE — H&P (Signed)
Kenneth Morgan is an 28 y.o. male.   Chief Complaint: depressed mood. myalgia HPI: recurrent severe depression  Past Medical History:  Diagnosis Date  . Anxiety   . Depression   . Hypertension     No past surgical history on file.  Family History  Problem Relation Age of Onset  . Drug abuse Mother   . Depression Mother   . Anxiety disorder Mother   . Bipolar disorder Mother   . Anxiety disorder Sister   . Depression Sister    Social History:  reports that he has never smoked. He has never used smokeless tobacco. He reports previous alcohol use. He reports previous drug use. Drug: Marijuana.  Allergies: No Known Allergies  (Not in a hospital admission)   No results found for this or any previous visit (from the past 48 hour(s)). No results found.  Review of Systems  Constitutional: Negative.   HENT: Negative.   Eyes: Negative.   Respiratory: Negative.   Cardiovascular: Negative.   Gastrointestinal: Negative.   Musculoskeletal: Positive for myalgias.  Skin: Negative.   Neurological: Negative.   Psychiatric/Behavioral: Positive for depression. Negative for hallucinations, memory loss, substance abuse and suicidal ideas. The patient is not nervous/anxious and does not have insomnia.     Blood pressure (!) 141/99, pulse 82, temperature 98.1 F (36.7 C), temperature source Oral, resp. rate 16, height 5\' 10"  (1.778 m), SpO2 100 %. Physical Exam  Nursing note and vitals reviewed. Constitutional: He appears well-developed and well-nourished.  HENT:  Head: Normocephalic and atraumatic.  Eyes: Pupils are equal, round, and reactive to light. Conjunctivae are normal.  Neck: Normal range of motion.  Cardiovascular: Regular rhythm and normal heart sounds.  Respiratory: Effort normal.  GI: Soft.  Musculoskeletal: Normal range of motion.  Neurological: He is alert.  Skin: Skin is warm and dry.  Psychiatric: Judgment normal. His affect is blunt. His speech is delayed. He  is not agitated. Cognition and memory are normal. He expresses no suicidal ideation.     Assessment/Plan Continue index tx  Alethia Berthold, MD 03/31/2019, 9:45 AM

## 2019-03-31 NOTE — Discharge Instructions (Signed)
1)  The drugs that you have been given will stay in your system until tomorrow so for the       next 24 hours you should not:  A. Drive an automobile  B. Make any legal decisions  C. Drink any alcoholic beverages  2)  You may resume your regular meals upon return home.  3)  A responsible adult must take you home.  Someone should stay with you for a few          hours, then be available by phone for the remainder of the treatment day.  4)  You May experience any of the following symptoms:  Headache, Nausea and a dry mouth (due to the medications you were given),  temporary memory loss and some confusion, or sore muscles (a warm bath  should help this).  If you you experience any of these symptoms let us know on                your return visit.  5)  Report any of the following: any acute discomfort, severe headache, or temperature        greater than 100.5 F.   Also report any unusual redness, swelling, drainage, or pain         at your IV site.    You may report Symptoms to:  [place] at [facility]          Phone: [phone], ECT Department           or Dr. Prescott Gum office [phone]  6)  Your next ECT Treatment is Friday June 12 at 8:30   We will call 2 days prior to your scheduled appointment for arrival times.  7)  Nothing to eat or drink after midnight the night before your procedure.  8)  Take      With a sip of water the morning of your procedure.  9)  Other Instructions: Call [phone] to cancel the morning of your procedure due         to illness or emergency.  10) We will call within 72 hours to assess how you are feeling.

## 2019-03-31 NOTE — Anesthesia Preprocedure Evaluation (Signed)
Anesthesia Evaluation  Patient identified by MRN, date of birth, ID band Patient awake    Reviewed: Allergy & Precautions, NPO status , Patient's Chart, lab work & pertinent test results  History of Anesthesia Complications Negative for: history of anesthetic complications  Airway Mallampati: III  TM Distance: >3 FB Neck ROM: Full    Dental no notable dental hx.    Pulmonary neg pulmonary ROS, neg sleep apnea, neg COPD,    breath sounds clear to auscultation- rhonchi (-) wheezing      Cardiovascular hypertension, (-) CAD, (-) Past MI, (-) Cardiac Stents and (-) CABG  Rhythm:Regular Rate:Normal - Systolic murmurs and - Diastolic murmurs    Neuro/Psych neg Seizures PSYCHIATRIC DISORDERS Anxiety Depression negative neurological ROS     GI/Hepatic negative GI ROS, Neg liver ROS,   Endo/Other  neg diabetesHypothyroidism   Renal/GU negative Renal ROS     Musculoskeletal negative musculoskeletal ROS (+)   Abdominal (+) + obese,   Peds  Hematology negative hematology ROS (+)   Anesthesia Other Findings Past Medical History: No date: Anxiety No date: Depression No date: Hypertension   Reproductive/Obstetrics                             Anesthesia Physical Anesthesia Plan  ASA: II  Anesthesia Plan: General   Post-op Pain Management:    Induction: Intravenous  PONV Risk Score and Plan: 1 and Ondansetron  Airway Management Planned: Mask  Additional Equipment:   Intra-op Plan:   Post-operative Plan:   Informed Consent: I have reviewed the patients History and Physical, chart, labs and discussed the procedure including the risks, benefits and alternatives for the proposed anesthesia with the patient or authorized representative who has indicated his/her understanding and acceptance.     Dental advisory given  Plan Discussed with: CRNA and Anesthesiologist  Anesthesia Plan  Comments:         Anesthesia Quick Evaluation

## 2019-04-01 ENCOUNTER — Other Ambulatory Visit: Payer: Self-pay | Admitting: Psychiatry

## 2019-04-02 ENCOUNTER — Encounter: Payer: Self-pay | Admitting: Anesthesiology

## 2019-04-02 ENCOUNTER — Ambulatory Visit
Admission: RE | Admit: 2019-04-02 | Discharge: 2019-04-02 | Disposition: A | Discharge status: Home / Self Care | Payer: BC Managed Care – PPO | Source: Ambulatory Visit | Attending: Psychiatry | Admitting: Psychiatry

## 2019-04-02 ENCOUNTER — Other Ambulatory Visit: Payer: Self-pay | Admitting: Psychiatry

## 2019-04-02 ENCOUNTER — Other Ambulatory Visit: Payer: Self-pay

## 2019-04-02 DIAGNOSIS — I10 Essential (primary) hypertension: Secondary | ICD-10-CM | POA: Diagnosis not present

## 2019-04-02 DIAGNOSIS — F419 Anxiety disorder, unspecified: Secondary | ICD-10-CM | POA: Insufficient documentation

## 2019-04-02 DIAGNOSIS — F332 Major depressive disorder, recurrent severe without psychotic features: Secondary | ICD-10-CM

## 2019-04-02 DIAGNOSIS — F329 Major depressive disorder, single episode, unspecified: Secondary | ICD-10-CM | POA: Diagnosis not present

## 2019-04-02 DIAGNOSIS — Z818 Family history of other mental and behavioral disorders: Secondary | ICD-10-CM | POA: Diagnosis not present

## 2019-04-02 MED ORDER — MIDAZOLAM HCL 2 MG/2ML IJ SOLN
INTRAMUSCULAR | Status: DC | PRN
  Administered 2019-04-02: 4 mg via INTRAVENOUS

## 2019-04-02 MED ORDER — SUCCINYLCHOLINE CHLORIDE 200 MG/10ML IV SOSY
PREFILLED_SYRINGE | INTRAVENOUS | Status: DC | PRN
  Administered 2019-04-02: 200 mg via INTRAVENOUS

## 2019-04-02 MED ORDER — SUCCINYLCHOLINE CHLORIDE 20 MG/ML IJ SOLN
INTRAMUSCULAR | Status: AC
  Filled 2019-04-02: qty 1

## 2019-04-02 MED ORDER — SODIUM CHLORIDE 0.9 % IV SOLN
INTRAVENOUS | Status: DC | PRN
  Administered 2019-04-02: 10:00:00 via INTRAVENOUS

## 2019-04-02 MED ORDER — METHOHEXITAL SODIUM 100 MG/10ML IV SOSY
PREFILLED_SYRINGE | INTRAVENOUS | Status: DC | PRN
  Administered 2019-04-02: 150 mg via INTRAVENOUS

## 2019-04-02 MED ORDER — MIDAZOLAM HCL 2 MG/2ML IJ SOLN
INTRAMUSCULAR | Status: AC
  Filled 2019-04-02: qty 4

## 2019-04-02 NOTE — Discharge Instructions (Signed)
1)  The drugs that you have been given will stay in your system until tomorrow so for the       next 24 hours you should not:  A. Drive an automobile  B. Make any legal decisions  C. Drink any alcoholic beverages  2)  You may resume your regular meals upon return home.  3)  A responsible adult must take you home.  Someone should stay with you for a few          hours, then be available by phone for the remainder of the treatment day.  4)  You May experience any of the following symptoms:  Headache, Nausea and a dry mouth (due to the medications you were given),  temporary memory loss and some confusion, or sore muscles (a warm bath  should help this).  If you you experience any of these symptoms let us know on                your return visit.  5)  Report any of the following: any acute discomfort, severe headache, or temperature        greater than 100.5 F.   Also report any unusual redness, swelling, drainage, or pain         at your IV site.    You may report Symptoms to:  [place] at [facility]          Phone: [phone], ECT Department           or Dr. Prescott Gum office [phone]  6)  Your next ECT Treatment is Monday June 15   We will call 2 days prior to your scheduled appointment for arrival times.  7)  Nothing to eat or drink after midnight the night before your procedure.  8)  Take    With a sip of water the morning of your procedure.  9)  Other Instructions: Call [phone] to cancel the morning of your procedure due         to illness or emergency.  10) We will call within 72 hours to assess how you are feeling.

## 2019-04-02 NOTE — Anesthesia Postprocedure Evaluation (Signed)
Anesthesia Post Note  Patient: Kenneth Morgan  Procedure(s) Performed: ECT TX  Patient location during evaluation: PACU Anesthesia Type: General Level of consciousness: awake and alert Pain management: pain level controlled Vital Signs Assessment: post-procedure vital signs reviewed and stable Respiratory status: spontaneous breathing, nonlabored ventilation and respiratory function stable Cardiovascular status: blood pressure returned to baseline and stable Postop Assessment: no apparent nausea or vomiting Anesthetic complications: no     Last Vitals:  Vitals:   04/02/19 1101 04/02/19 1111  BP: (!) 169/86 (!) 160/63  Pulse: 95 94  Resp: 18 (!) 6  Temp: 36.9 C   SpO2: 95%     Last Pain:  Vitals:   04/02/19 1101  TempSrc:   PainSc: 0-No pain                 Durenda Hurt

## 2019-04-02 NOTE — Procedures (Signed)
ECT SERVICES Physician's Interval Evaluation & Treatment Note  Patient Identification: CARLOUS OLIVARES MRN:  [id number] Date of Evaluation:  04/02/2019 TX #: 3   MADRS:   MMSE:   P.E. Findings:  No change physical exam  Psychiatric Interval Note:  Subjectively slightly better perhaps  Subjective:  Patient is a 28 y.o. male seen for evaluation for Electroconvulsive Therapy. Still feeling depressed  Treatment Summary:   [x]   Right Unilateral             []  Bilateral   % Energy : 0.3 ms 30%   Impedance: 1340 ohms  Seizure Energy Index: 6107 V squared  Postictal Suppression Index: 58%  Seizure Concordance Index: 95%  Medications  Pre Shock: Robinul 0.2 mg Brevital 150 mg succinylcholine 200 mg  Post Shock: Versed 4 mg  Seizure Duration: 20 seconds EMG 51 seconds EEG   Comments: Follow-up on Monday  Lungs:  [x]   Clear to auscultation               []  Other:   Heart:    [x]   Regular rhythm             []  irregular rhythm    [x]   Previous H&P reviewed, patient examined and there are NO CHANGES                 []   Previous H&P reviewed, patient examined and there are changes noted.   Alethia Berthold, MD 6/12/202010:24 AM

## 2019-04-02 NOTE — H&P (Signed)
Kenneth Morgan is an 28 y.o. male.   Chief Complaint: Mood has been stable possibly a little bit better.  No new physical complaints HPI: History of recurrent severe depression receiving ECT today on treatment #3  Past Medical History:  Diagnosis Date  . Anxiety   . Depression   . Hypertension     History reviewed. No pertinent surgical history.  Family History  Problem Relation Age of Onset  . Drug abuse Mother   . Depression Mother   . Anxiety disorder Mother   . Bipolar disorder Mother   . Anxiety disorder Sister   . Depression Sister    Social History:  reports that he has never smoked. He has never used smokeless tobacco. He reports previous alcohol use. He reports previous drug use. Drug: Marijuana.  Allergies: No Known Allergies  (Not in a hospital admission)   No results found for this or any previous visit (from the past 48 hour(s)). No results found.  Review of Systems  Constitutional: Negative.   HENT: Negative.   Eyes: Negative.   Respiratory: Negative.   Cardiovascular: Negative.   Gastrointestinal: Negative.   Musculoskeletal: Negative.   Skin: Negative.   Neurological: Negative.   Psychiatric/Behavioral: Positive for depression. Negative for hallucinations, memory loss, substance abuse and suicidal ideas. The patient is not nervous/anxious and does not have insomnia.     Blood pressure 140/86, pulse 82, temperature 97.7 F (36.5 C), temperature source Oral, resp. rate 16, height 5\' 10"  (1.778 m), weight 128.8 kg. Physical Exam  Nursing note and vitals reviewed. Constitutional: He appears well-developed and well-nourished.  HENT:  Head: Normocephalic and atraumatic.  Eyes: Pupils are equal, round, and reactive to light. Conjunctivae are normal.  Neck: Normal range of motion.  Cardiovascular: Regular rhythm and normal heart sounds.  Respiratory: Effort normal.  GI: Soft.  Musculoskeletal: Normal range of motion.  Neurological: He is alert.   Skin: Skin is warm and dry.  Psychiatric: He has a normal mood and affect. His behavior is normal. Judgment and thought content normal.     Assessment/Plan Continue index course into next week  Alethia Berthold, MD 04/02/2019, 10:22 AM

## 2019-04-02 NOTE — Transfer of Care (Signed)
Immediate Anesthesia Transfer of Care Note  Patient: Kenneth Morgan  Procedure(s) Performed: ECT TX  Patient Location: PACU  Anesthesia Type:General  Level of Consciousness: drowsy and patient cooperative  Airway & Oxygen Therapy: Patient Spontanous Breathing and Patient connected to face mask oxygen  Post-op Assessment: Report given to RN and Post -op Vital signs reviewed and stable  Post vital signs: Reviewed  Last Vitals:  Vitals Value Taken Time  BP 166/108 04/02/19 1041  Temp 37.1 C 04/02/19 1041  Pulse 103 04/02/19 1042  Resp 19 04/02/19 1042  SpO2 96 % 04/02/19 1042  Vitals shown include unvalidated device data.  Last Pain:  Vitals:   04/02/19 0835  TempSrc:   PainSc: 0-No pain         Complications: No apparent anesthesia complications

## 2019-04-02 NOTE — Anesthesia Preprocedure Evaluation (Addendum)
Anesthesia Evaluation  Patient identified by MRN, date of birth, ID band Patient awake    Reviewed: Allergy & Precautions, H&P , NPO status , Patient's Chart, lab work & pertinent test results, reviewed documented beta blocker date and time   Airway Mallampati: II   Neck ROM: full    Dental  (+) Poor Dentition   Pulmonary neg pulmonary ROS, neg shortness of breath, neg recent URI,    Pulmonary exam normal        Cardiovascular hypertension, On Medications (-) anginaNormal cardiovascular exam Rhythm:regular Rate:Normal     Neuro/Psych PSYCHIATRIC DISORDERS Anxiety Depression negative neurological ROS     GI/Hepatic negative GI ROS, Neg liver ROS,   Endo/Other  Morbid obesity  Renal/GU negative Renal ROS  negative genitourinary   Musculoskeletal   Abdominal   Peds  Hematology negative hematology ROS (+)   Anesthesia Other Findings Past Medical History: No date: Anxiety No date: Depression No date: Hypertension No past surgical history on file. BMI    Body Mass Index:  40.89 kg/m     Reproductive/Obstetrics negative OB ROS                            Anesthesia Physical  Anesthesia Plan  ASA: III  Anesthesia Plan: General   Post-op Pain Management:    Induction:   PONV Risk Score and Plan:   Airway Management Planned: Natural Airway and Mask  Additional Equipment:   Intra-op Plan:   Post-operative Plan:   Informed Consent: I have reviewed the patients History and Physical, chart, labs and discussed the procedure including the risks, benefits and alternatives for the proposed anesthesia with the patient or authorized representative who has indicated his/her understanding and acceptance.       Plan Discussed with: Anesthesiologist  Anesthesia Plan Comments:         Anesthesia Quick Evaluation

## 2019-04-02 NOTE — Anesthesia Post-op Follow-up Note (Signed)
Anesthesia QCDR form completed.        

## 2019-04-05 ENCOUNTER — Encounter: Payer: Self-pay | Admitting: Anesthesiology

## 2019-04-05 ENCOUNTER — Other Ambulatory Visit: Payer: Self-pay

## 2019-04-05 ENCOUNTER — Encounter (HOSPITAL_BASED_OUTPATIENT_CLINIC_OR_DEPARTMENT_OTHER)
Admission: RE | Admit: 2019-04-05 | Discharge: 2019-04-05 | Disposition: A | Discharge status: Home / Self Care | Payer: BC Managed Care – PPO | Source: Ambulatory Visit | Attending: Psychiatry | Admitting: Psychiatry

## 2019-04-05 DIAGNOSIS — Z1159 Encounter for screening for other viral diseases: Secondary | ICD-10-CM | POA: Diagnosis not present

## 2019-04-05 DIAGNOSIS — F419 Anxiety disorder, unspecified: Secondary | ICD-10-CM | POA: Diagnosis not present

## 2019-04-05 DIAGNOSIS — F329 Major depressive disorder, single episode, unspecified: Secondary | ICD-10-CM | POA: Diagnosis not present

## 2019-04-05 DIAGNOSIS — F418 Other specified anxiety disorders: Secondary | ICD-10-CM | POA: Diagnosis not present

## 2019-04-05 DIAGNOSIS — M791 Myalgia, unspecified site: Secondary | ICD-10-CM | POA: Diagnosis not present

## 2019-04-05 DIAGNOSIS — I10 Essential (primary) hypertension: Secondary | ICD-10-CM | POA: Diagnosis not present

## 2019-04-05 DIAGNOSIS — F332 Major depressive disorder, recurrent severe without psychotic features: Secondary | ICD-10-CM

## 2019-04-05 MED ORDER — SODIUM CHLORIDE 0.9 % IV SOLN
500.0000 mL | Freq: Once | INTRAVENOUS | Status: AC
  Administered 2019-04-05: 10:00:00 via INTRAVENOUS

## 2019-04-05 MED ORDER — GLYCOPYRROLATE 0.2 MG/ML IJ SOLN
0.2000 mg | Freq: Once | INTRAMUSCULAR | Status: AC
  Administered 2019-04-05: 0.2 mg via INTRAVENOUS

## 2019-04-05 MED ORDER — METHOHEXITAL SODIUM 100 MG/10ML IV SOSY
PREFILLED_SYRINGE | INTRAVENOUS | Status: DC | PRN
  Administered 2019-04-05: 150 mg via INTRAVENOUS

## 2019-04-05 MED ORDER — KETOROLAC TROMETHAMINE 30 MG/ML IJ SOLN: 30.0000 mg | Freq: Once | INTRAMUSCULAR | Status: DC

## 2019-04-05 MED ORDER — MIDAZOLAM HCL 2 MG/2ML IJ SOLN: 4.0000 mg | Freq: Once | INTRAMUSCULAR | Status: DC

## 2019-04-05 MED ORDER — MIDAZOLAM HCL 2 MG/2ML IJ SOLN
INTRAMUSCULAR | Status: DC | PRN
  Administered 2019-04-05: 4 mg via INTRAVENOUS

## 2019-04-05 MED ORDER — GLYCOPYRROLATE 0.2 MG/ML IJ SOLN: 0.2000 mg | Freq: Once | INTRAMUSCULAR | Status: DC

## 2019-04-05 MED ORDER — SUCCINYLCHOLINE CHLORIDE 20 MG/ML IJ SOLN
INTRAMUSCULAR | Status: AC
  Filled 2019-04-05: qty 1

## 2019-04-05 MED ORDER — ONDANSETRON HCL 4 MG/2ML IJ SOLN: 4.0000 mg | Freq: Once | INTRAMUSCULAR | Status: DC | PRN

## 2019-04-05 MED ORDER — MIDAZOLAM HCL 2 MG/2ML IJ SOLN
INTRAMUSCULAR | Status: AC
  Filled 2019-04-05: qty 4

## 2019-04-05 MED ORDER — SODIUM CHLORIDE 0.9 % IV SOLN
500.0000 mL | Freq: Once | INTRAVENOUS | Status: AC
  Administered 2019-04-05: 500 mL via INTRAVENOUS

## 2019-04-05 MED ORDER — FENTANYL CITRATE (PF) 100 MCG/2ML IJ SOLN: 25.0000 ug | INTRAMUSCULAR | Status: DC | PRN

## 2019-04-05 MED ORDER — SUCCINYLCHOLINE CHLORIDE 200 MG/10ML IV SOSY
PREFILLED_SYRINGE | INTRAVENOUS | Status: DC | PRN
  Administered 2019-04-05: 200 mg via INTRAVENOUS

## 2019-04-05 MED ORDER — GLYCOPYRROLATE 0.2 MG/ML IJ SOLN
INTRAMUSCULAR | Status: AC
  Filled 2019-04-05: qty 1

## 2019-04-05 NOTE — Transfer of Care (Signed)
Immediate Anesthesia Transfer of Care Note  Patient: ALEXY HELDT  Procedure(s) Performed: ECT TX  Patient Location: PACU  Anesthesia Type:General  Level of Consciousness: sedated  Airway & Oxygen Therapy: Patient Spontanous Breathing and Patient connected to face mask oxygen  Post-op Assessment: Report given to RN and Post -op Vital signs reviewed and stable  Post vital signs: Reviewed and stable  Last Vitals:  Vitals Value Taken Time  BP 155/98 04/05/19 1058  Temp 37.3 C 04/05/19 1058  Pulse 127 04/05/19 1101  Resp 22 04/05/19 1101  SpO2 98 % 04/05/19 1101  Vitals shown include unvalidated device data.  Last Pain:  Vitals:   04/05/19 1058  TempSrc:   PainSc: 0-No pain         Complications: No apparent anesthesia complications

## 2019-04-05 NOTE — Anesthesia Preprocedure Evaluation (Signed)
Anesthesia Evaluation  Patient identified by MRN, date of birth, ID band Patient awake    Reviewed: Allergy & Precautions, H&P , NPO status , Patient's Chart, lab work & pertinent test results, reviewed documented beta blocker date and time   Airway Mallampati: II   Neck ROM: full    Dental  (+) Poor Dentition   Pulmonary neg pulmonary ROS,    Pulmonary exam normal        Cardiovascular Exercise Tolerance: Good hypertension, On Medications negative cardio ROS Normal cardiovascular exam Rhythm:regular Rate:Normal     Neuro/Psych PSYCHIATRIC DISORDERS Anxiety Depression negative neurological ROS  negative psych ROS   GI/Hepatic negative GI ROS, Neg liver ROS,   Endo/Other  Morbid obesity  Renal/GU negative Renal ROS  negative genitourinary   Musculoskeletal   Abdominal   Peds  Hematology negative hematology ROS (+)   Anesthesia Other Findings Past Medical History: No date: Anxiety No date: Depression No date: Hypertension History reviewed. No pertinent surgical history. BMI    Body Mass Index: 41.47 kg/m     Reproductive/Obstetrics negative OB ROS                             Anesthesia Physical Anesthesia Plan  ASA: III  Anesthesia Plan: General   Post-op Pain Management:    Induction:   PONV Risk Score and Plan:   Airway Management Planned:   Additional Equipment:   Intra-op Plan:   Post-operative Plan:   Informed Consent: I have reviewed the patients History and Physical, chart, labs and discussed the procedure including the risks, benefits and alternatives for the proposed anesthesia with the patient or authorized representative who has indicated his/her understanding and acceptance.     Dental Advisory Given  Plan Discussed with: CRNA  Anesthesia Plan Comments:         Anesthesia Quick Evaluation

## 2019-04-05 NOTE — Anesthesia Procedure Notes (Signed)
Date/Time: 04/05/2019 10:47 AM Performed by: Dionne Bucy, CRNA Pre-anesthesia Checklist: Patient identified, Emergency Drugs available, Suction available and Patient being monitored Patient Re-evaluated:Patient Re-evaluated prior to induction Oxygen Delivery Method: Circle system utilized Preoxygenation: Pre-oxygenation with 100% oxygen Induction Type: IV induction Ventilation: Mask ventilation without difficulty and Mask ventilation throughout procedure Airway Equipment and Method: Bite block Placement Confirmation: positive ETCO2 Dental Injury: Teeth and Oropharynx as per pre-operative assessment

## 2019-04-05 NOTE — Discharge Instructions (Signed)
1)  The drugs that you have been given will stay in your system until tomorrow so for the       next 24 hours you should not:  A. Drive an automobile  B. Make any legal decisions  C. Drink any alcoholic beverages  2)  You may resume your regular meals upon return home.  3)  A responsible adult must take you home.  Someone should stay with you for a few          hours, then be available by phone for the remainder of the treatment day.  4)  You May experience any of the following symptoms:  Headache, Nausea and a dry mouth (due to the medications you were given),  temporary memory loss and some confusion, or sore muscles (a warm bath  should help this).  If you you experience any of these symptoms let us know on                your return visit.  5)  Report any of the following: any acute discomfort, severe headache, or temperature        greater than 100.5 F.   Also report any unusual redness, swelling, drainage, or pain         at your IV site.    You may report Symptoms to:  [place] at [facility]          Phone: [phone], ECT Department           or Dr. Prescott Gum office [phone]  6)  Your next ECT Treatment is Day Wednesday  Date June 17,2020 at 0815  We will call 2 days prior to your scheduled appointment for arrival times.  7)  Nothing to eat or drink after midnight the night before your procedure.  8)  Take .    With a sip of water the morning of your procedure.  9)  Other Instructions: Call [phone] to cancel the morning of your procedure due         to illness or emergency.  10) We will call within 72 hours to assess how you are feeling.

## 2019-04-05 NOTE — Procedures (Signed)
ECT SERVICES Physician's Interval Evaluation & Treatment Note  Patient Identification: Kenneth Morgan MRN:  [id number] Date of Evaluation:  04/05/2019 TX #: 4  MADRS:   MMSE:   P.E. Findings:  No change to physical exam  Psychiatric Interval Note:  Still talks about suicide with nursing although his affect seems pleasant enough and he seemed to indicate to me that he thought he might be a little better.  Subjective:  Patient is a 28 y.o. male seen for evaluation for Electroconvulsive Therapy. Hard to really nail down how he is feeling although he looks like he is doing a little better  Treatment Summary:   [x]   Right Unilateral             []  Bilateral   % Energy : 0.3 ms 30%   Impedance: 1400 ohms  Seizure Energy Index: 4485 V squared  Postictal Suppression Index: No reading obtained  Seizure Concordance Index: 84%  Medications  Pre Shock: Robinul 0.2 mg Brevital 150 mg succinylcholine 200 mg  Post Shock: Versed 4 mg  Seizure Duration: 17 seconds EMG 37 seconds EEG   Comments: Encourage patient to be more open and sharing his feelings and thoughts with the doctor as well.  Next treatment Wednesday.  Continue index course for now.  Lungs:  [x]   Clear to auscultation               []  Other:   Heart:    [x]   Regular rhythm             []  irregular rhythm    [x]   Previous H&P reviewed, patient examined and there are NO CHANGES                 []   Previous H&P reviewed, patient examined and there are changes noted.   Alethia Berthold, MD 6/15/20205:36 PM

## 2019-04-05 NOTE — Anesthesia Postprocedure Evaluation (Signed)
Anesthesia Post Note  Patient: Kenneth Morgan  Procedure(s) Performed: ECT TX  Patient location during evaluation: PACU Anesthesia Type: General Level of consciousness: awake and alert Pain management: pain level controlled Vital Signs Assessment: post-procedure vital signs reviewed and stable Respiratory status: spontaneous breathing, nonlabored ventilation and respiratory function stable Cardiovascular status: blood pressure returned to baseline and stable Postop Assessment: no signs of nausea or vomiting Anesthetic complications: no     Last Vitals:  Vitals:   04/05/19 1124 04/05/19 1129  BP: (!) 159/102 (!) 146/95  Pulse: (!) 101 95  Resp: 18 18  Temp: 37.2 C   SpO2: 98%     Last Pain:  Vitals:   04/05/19 1129  TempSrc:   PainSc: 0-No pain                 Alphonsus Doyel

## 2019-04-05 NOTE — Anesthesia Post-op Follow-up Note (Signed)
Anesthesia QCDR form completed.        

## 2019-04-05 NOTE — H&P (Signed)
Kenneth Morgan is an 28 y.o. male.   Chief Complaint: Slightly better but still having some low mood. Sleeping worse HPI: Recurrent depression   Past Medical History:  Diagnosis Date  . Anxiety   . Depression   . Hypertension     History reviewed. No pertinent surgical history.  Family History  Problem Relation Age of Onset  . Drug abuse Mother   . Depression Mother   . Anxiety disorder Mother   . Bipolar disorder Mother   . Anxiety disorder Sister   . Depression Sister    Social History:  reports that he has never smoked. He has never used smokeless tobacco. He reports previous alcohol use. He reports previous drug use. Drug: Marijuana.  Allergies: No Known Allergies  (Not in a hospital admission)   No results found for this or any previous visit (from the past 48 hour(s)). No results found.  Review of Systems  Constitutional: Negative.   HENT: Negative.   Eyes: Negative.   Respiratory: Negative.   Cardiovascular: Negative.   Gastrointestinal: Negative.   Musculoskeletal: Negative.   Skin: Negative.   Neurological: Negative.   Psychiatric/Behavioral: Positive for depression. Negative for hallucinations, memory loss, substance abuse and suicidal ideas. The patient has insomnia. The patient is not nervous/anxious.     Pulse 88, temperature 98.4 F (36.9 C), temperature source Oral, resp. rate 18, height 5\' 10"  (1.778 m), weight 131.1 kg, SpO2 100 %. Physical Exam  Nursing note and vitals reviewed. Constitutional: He appears well-developed and well-nourished.  HENT:  Head: Normocephalic and atraumatic.  Eyes: Pupils are equal, round, and reactive to light. Conjunctivae are normal.  Neck: Normal range of motion.  Cardiovascular: Regular rhythm and normal heart sounds.  Respiratory: Effort normal.  GI: Soft.  Musculoskeletal: Normal range of motion.  Neurological: He is alert.  Skin: Skin is warm and dry.  Psychiatric: He has a normal mood and affect. His  behavior is normal. Judgment and thought content normal.     Assessment/Plan Cont 3x week. Patient brought in helpful old records of prior treatments. Will add in an extra note  Alethia Berthold, MD 04/05/2019, 9:59 AM

## 2019-04-07 ENCOUNTER — Other Ambulatory Visit: Payer: Self-pay

## 2019-04-07 ENCOUNTER — Encounter (HOSPITAL_BASED_OUTPATIENT_CLINIC_OR_DEPARTMENT_OTHER)
Admission: RE | Admit: 2019-04-07 | Discharge: 2019-04-07 | Disposition: A | Discharge status: Home / Self Care | Payer: BC Managed Care – PPO | Source: Ambulatory Visit | Attending: Psychiatry | Admitting: Psychiatry

## 2019-04-07 ENCOUNTER — Encounter: Payer: Self-pay | Admitting: Anesthesiology

## 2019-04-07 DIAGNOSIS — R45851 Suicidal ideations: Secondary | ICD-10-CM | POA: Insufficient documentation

## 2019-04-07 DIAGNOSIS — I10 Essential (primary) hypertension: Secondary | ICD-10-CM | POA: Insufficient documentation

## 2019-04-07 DIAGNOSIS — F332 Major depressive disorder, recurrent severe without psychotic features: Secondary | ICD-10-CM

## 2019-04-07 DIAGNOSIS — Z1159 Encounter for screening for other viral diseases: Secondary | ICD-10-CM | POA: Diagnosis not present

## 2019-04-07 DIAGNOSIS — F329 Major depressive disorder, single episode, unspecified: Secondary | ICD-10-CM | POA: Diagnosis not present

## 2019-04-07 DIAGNOSIS — F419 Anxiety disorder, unspecified: Secondary | ICD-10-CM | POA: Insufficient documentation

## 2019-04-07 DIAGNOSIS — M791 Myalgia, unspecified site: Secondary | ICD-10-CM | POA: Diagnosis not present

## 2019-04-07 MED ORDER — SODIUM CHLORIDE 0.9 % IV SOLN
500.0000 mL | Freq: Once | INTRAVENOUS | Status: AC
  Administered 2019-04-07: 500 mL via INTRAVENOUS

## 2019-04-07 MED ORDER — GLYCOPYRROLATE 0.2 MG/ML IJ SOLN
0.2000 mg | Freq: Once | INTRAMUSCULAR | Status: AC
  Administered 2019-04-07: 0.2 mg via INTRAVENOUS

## 2019-04-07 MED ORDER — MIDAZOLAM HCL 2 MG/2ML IJ SOLN
4.0000 mg | Freq: Once | INTRAMUSCULAR | Status: AC
  Administered 2019-04-07: 4 mg via INTRAVENOUS

## 2019-04-07 MED ORDER — MIDAZOLAM HCL 2 MG/2ML IJ SOLN
INTRAMUSCULAR | Status: AC
  Filled 2019-04-07: qty 4

## 2019-04-07 MED ORDER — GLYCOPYRROLATE 0.2 MG/ML IJ SOLN
INTRAMUSCULAR | Status: AC
  Filled 2019-04-07: qty 1

## 2019-04-07 MED ORDER — ONDANSETRON HCL 4 MG/2ML IJ SOLN: 4.0000 mg | Freq: Once | INTRAMUSCULAR | Status: DC | PRN

## 2019-04-07 MED ORDER — KETOROLAC TROMETHAMINE 30 MG/ML IJ SOLN
INTRAMUSCULAR | Status: AC
  Filled 2019-04-07: qty 1

## 2019-04-07 MED ORDER — METHOHEXITAL SODIUM 100 MG/10ML IV SOSY
PREFILLED_SYRINGE | INTRAVENOUS | Status: DC | PRN
  Administered 2019-04-07: 150 mg via INTRAVENOUS

## 2019-04-07 MED ORDER — SODIUM CHLORIDE 0.9 % IV SOLN
INTRAVENOUS | Status: DC | PRN
  Administered 2019-04-07: 10:00:00 via INTRAVENOUS

## 2019-04-07 MED ORDER — SUCCINYLCHOLINE CHLORIDE 20 MG/ML IJ SOLN
INTRAMUSCULAR | Status: DC | PRN
  Administered 2019-04-07: 200 mg via INTRAVENOUS

## 2019-04-07 MED ORDER — KETOROLAC TROMETHAMINE 30 MG/ML IJ SOLN
30.0000 mg | Freq: Once | INTRAMUSCULAR | Status: AC
  Administered 2019-04-07: 30 mg via INTRAVENOUS

## 2019-04-07 NOTE — Procedures (Signed)
ECT SERVICES Physician's Interval Evaluation & Treatment Note  Patient Identification: Kenneth Morgan MRN:  [id number] Date of Evaluation:  04/07/2019 TX #: 5  MADRS:   MMSE:   P.E. Findings:  No change to physical exam  Psychiatric Interval Note:  Patient reports mood still being depressed and has some occasional suicidal thoughts.  Outwardly seems stable.  Feels like he might be a little better than when he first started.  Subjective:  Patient is a 28 y.o. male seen for evaluation for Electroconvulsive Therapy. No specific new complaint.  Chronic depression  Treatment Summary:   [x]   Right Unilateral             []  Bilateral   % Energy : 0.3 ms 50%   Impedance: 2110 ohms  Seizure Energy Index: 12,594 V squared  Postictal Suppression Index: 62%  Seizure Concordance Index: 96%  Medications  Pre Shock: Robinul 0.2 mg Brevital 150 mg succinylcholine 200 mg  Post Shock: Versed 4 mg  Seizure Duration: 19 seconds EMG 37 seconds EEG   Comments: Follow-up next treatment Friday  Lungs:  []   Clear to auscultation               []  Other:   Heart:    [x]   Regular rhythm             []  irregular rhythm    [x]   Previous H&P reviewed, patient examined and there are NO CHANGES                 []   Previous H&P reviewed, patient examined and there are changes noted.   Alethia Berthold, MD 6/17/202010:29 AM

## 2019-04-07 NOTE — Anesthesia Preprocedure Evaluation (Signed)
Anesthesia Evaluation  Patient identified by MRN, date of birth, ID band Patient awake    Reviewed: Allergy & Precautions, H&P , NPO status , Patient's Chart, lab work & pertinent test results, reviewed documented beta blocker date and time   Airway Mallampati: II   Neck ROM: full    Dental  (+) Poor Dentition   Pulmonary neg pulmonary ROS,    Pulmonary exam normal        Cardiovascular Exercise Tolerance: Good hypertension, On Medications negative cardio ROS Normal cardiovascular exam Rhythm:regular Rate:Normal     Neuro/Psych PSYCHIATRIC DISORDERS Anxiety Depression negative neurological ROS  negative psych ROS   GI/Hepatic negative GI ROS, Neg liver ROS,   Endo/Other  Morbid obesity  Renal/GU negative Renal ROS  negative genitourinary   Musculoskeletal   Abdominal   Peds  Hematology negative hematology ROS (+)   Anesthesia Other Findings   Reproductive/Obstetrics negative OB ROS                             Anesthesia Physical  Anesthesia Plan  ASA: III  Anesthesia Plan: General   Post-op Pain Management:    Induction:   PONV Risk Score and Plan:   Airway Management Planned:   Additional Equipment:   Intra-op Plan:   Post-operative Plan:   Informed Consent: I have reviewed the patients History and Physical, chart, labs and discussed the procedure including the risks, benefits and alternatives for the proposed anesthesia with the patient or authorized representative who has indicated his/her understanding and acceptance.       Plan Discussed with:   Anesthesia Plan Comments:         Anesthesia Quick Evaluation

## 2019-04-07 NOTE — H&P (Signed)
Kenneth Morgan is an 28 y.o. male.   Chief Complaint: depressed mood HPI: passive suicidality without change  Past Medical History:  Diagnosis Date  . Anxiety   . Depression   . Hypertension     No past surgical history on file.  Family History  Problem Relation Age of Onset  . Drug abuse Mother   . Depression Mother   . Anxiety disorder Mother   . Bipolar disorder Mother   . Anxiety disorder Sister   . Depression Sister    Social History:  reports that he has never smoked. He has never used smokeless tobacco. He reports previous alcohol use. He reports previous drug use. Drug: Marijuana.  Allergies: No Known Allergies  (Not in a hospital admission)   No results found for this or any previous visit (from the past 48 hour(s)). No results found.  Review of Systems  Constitutional: Negative.   HENT: Negative.   Eyes: Negative.   Respiratory: Negative.   Cardiovascular: Negative.   Gastrointestinal: Negative.   Musculoskeletal: Negative.   Skin: Negative.   Neurological: Negative.   Psychiatric/Behavioral: Positive for depression and suicidal ideas. Negative for hallucinations, memory loss and substance abuse. The patient is not nervous/anxious and does not have insomnia.     Blood pressure (!) 158/96, temperature 98.3 F (36.8 C), temperature source Oral, height 5\' 10"  (1.778 m), weight 131.1 kg, SpO2 100 %. Physical Exam  Nursing note and vitals reviewed. Constitutional: He appears well-developed and well-nourished.  HENT:  Head: Normocephalic and atraumatic.  Eyes: Pupils are equal, round, and reactive to light. Conjunctivae are normal.  Neck: Normal range of motion.  Cardiovascular: Regular rhythm and normal heart sounds.  Respiratory: Effort normal. No respiratory distress.  GI: Soft.  Musculoskeletal: Normal range of motion.  Neurological: He is alert.  Skin: Skin is warm and dry.  Psychiatric: Judgment normal. His speech is delayed. He is slowed.  Cognition and memory are normal. He exhibits a depressed mood. He expresses suicidal ideation. He expresses no suicidal plans.     Assessment/Plan RUL and next treatment friday  Alethia Berthold, MD 04/07/2019, 9:49 AM

## 2019-04-07 NOTE — Anesthesia Post-op Follow-up Note (Signed)
Anesthesia QCDR form completed.        

## 2019-04-07 NOTE — Discharge Instructions (Signed)
1)  The drugs that you have been given will stay in your system until tomorrow so for the       next 24 hours you should not:  A. Drive an automobile  B. Make any legal decisions  C. Drink any alcoholic beverages  2)  You may resume your regular meals upon return home.  3)  A responsible adult must take you home.  Someone should stay with you for a few          hours, then be available by phone for the remainder of the treatment day.  4)  You May experience any of the following symptoms:  Headache, Nausea and a dry mouth (due to the medications you were given),  temporary memory loss and some confusion, or sore muscles (a warm bath  should help this).  If you you experience any of these symptoms let us know on                your return visit.  5)  Report any of the following: any acute discomfort, severe headache, or temperature        greater than 100.5 F.   Also report any unusual redness, swelling, drainage, or pain         at your IV site.    You may report Symptoms to:  [place] [facility]          Phone: [phone], ECT Department           or Dr. Prescott Gum office [phone]  6)  Your next ECT Treatment is Friday June 19 at 8:30  We will call 2 days prior to your scheduled appointment for arrival times.  7)  Nothing to eat or drink after midnight the night before your procedure.  8)  Take     With a sip of water the morning of your procedure.  9)  Other Instructions: Call [phone] to cancel the morning of your procedure due         to illness or emergency.  10) We will call within 72 hours to assess how you are feeling.

## 2019-04-07 NOTE — Anesthesia Procedure Notes (Signed)
Date/Time: 04/07/2019 10:44 AM Performed by: Nelda Marseille, CRNA Pre-anesthesia Checklist: Patient identified, Emergency Drugs available, Suction available, Patient being monitored and Timeout performed Oxygen Delivery Method: Ambu bag Ventilation: Mask ventilation throughout procedure

## 2019-04-07 NOTE — Anesthesia Postprocedure Evaluation (Signed)
Anesthesia Post Note  Patient: Kenneth Morgan  Procedure(s) Performed: ECT TX  Patient location during evaluation: Endoscopy Anesthesia Type: General Level of consciousness: sedated Pain management: pain level controlled Vital Signs Assessment: post-procedure vital signs reviewed and stable Respiratory status: spontaneous breathing and respiratory function stable Cardiovascular status: stable Anesthetic complications: no     Last Vitals:  Vitals:   04/07/19 1118 04/07/19 1123  BP: (!) 141/79 138/78  Pulse: (!) 107 95  Resp: 19 18  Temp:    SpO2: 98%     Last Pain:  Vitals:   04/07/19 1123  TempSrc:   PainSc: 0-No pain                 KEPHART,WILLIAM K

## 2019-04-07 NOTE — Transfer of Care (Signed)
Immediate Anesthesia Transfer of Care Note  Patient: Kenneth Morgan  Procedure(s) Performed: ECT TX  Patient Location: PACU  Anesthesia Type:General  Level of Consciousness: sedated  Airway & Oxygen Therapy: Patient Spontanous Breathing and Patient connected to face mask oxygen  Post-op Assessment: Report given to RN and Post -op Vital signs reviewed and stable  Post vital signs: Reviewed and stable  Last Vitals:  Vitals Value Taken Time  BP 146/91 04/07/19 1048  Temp 36.9 C 04/07/19 1048  Pulse 114 04/07/19 1048  Resp 19 04/07/19 1048  SpO2 98 % 04/07/19 1048    Last Pain:  Vitals:   04/07/19 0811  TempSrc: Oral         Complications: No apparent anesthesia complications

## 2019-04-08 ENCOUNTER — Other Ambulatory Visit: Payer: Self-pay | Admitting: Psychiatry

## 2019-04-09 ENCOUNTER — Encounter: Payer: Self-pay | Admitting: Anesthesiology

## 2019-04-09 ENCOUNTER — Encounter
Admission: RE | Admit: 2019-04-09 | Discharge: 2019-04-09 | Disposition: A | Discharge status: Home / Self Care | Payer: BC Managed Care – PPO | Source: Ambulatory Visit | Attending: Psychiatry | Admitting: Psychiatry

## 2019-04-09 ENCOUNTER — Other Ambulatory Visit: Payer: Self-pay

## 2019-04-09 DIAGNOSIS — R45851 Suicidal ideations: Secondary | ICD-10-CM | POA: Diagnosis not present

## 2019-04-09 DIAGNOSIS — F329 Major depressive disorder, single episode, unspecified: Secondary | ICD-10-CM | POA: Diagnosis not present

## 2019-04-09 DIAGNOSIS — F332 Major depressive disorder, recurrent severe without psychotic features: Secondary | ICD-10-CM | POA: Diagnosis not present

## 2019-04-09 DIAGNOSIS — Z1159 Encounter for screening for other viral diseases: Secondary | ICD-10-CM | POA: Diagnosis not present

## 2019-04-09 DIAGNOSIS — M791 Myalgia, unspecified site: Secondary | ICD-10-CM | POA: Diagnosis not present

## 2019-04-09 DIAGNOSIS — I10 Essential (primary) hypertension: Secondary | ICD-10-CM | POA: Diagnosis not present

## 2019-04-09 DIAGNOSIS — F419 Anxiety disorder, unspecified: Secondary | ICD-10-CM | POA: Diagnosis not present

## 2019-04-09 LAB — SARS CORONAVIRUS 2 BY RT PCR (HOSPITAL ORDER, PERFORMED IN ~~LOC~~ HOSPITAL LAB): SARS Coronavirus 2: NEGATIVE

## 2019-04-09 MED ORDER — SUCCINYLCHOLINE CHLORIDE 20 MG/ML IJ SOLN
INTRAMUSCULAR | Status: AC
  Filled 2019-04-09: qty 1

## 2019-04-09 MED ORDER — METHOHEXITAL SODIUM 100 MG/10ML IV SOSY
PREFILLED_SYRINGE | INTRAVENOUS | Status: DC | PRN
  Administered 2019-04-09: 150 mg via INTRAVENOUS

## 2019-04-09 MED ORDER — SODIUM CHLORIDE 0.9 % IV SOLN: 500.0000 mL | Freq: Once | INTRAVENOUS | Status: DC

## 2019-04-09 MED ORDER — MIDAZOLAM HCL 2 MG/2ML IJ SOLN
INTRAMUSCULAR | Status: DC | PRN
  Administered 2019-04-09: 4 mg via INTRAVENOUS

## 2019-04-09 MED ORDER — METHOHEXITAL SODIUM 0.5 G IJ SOLR
INTRAMUSCULAR | Status: AC
  Filled 2019-04-09: qty 500

## 2019-04-09 MED ORDER — MIDAZOLAM HCL 2 MG/2ML IJ SOLN
INTRAMUSCULAR | Status: AC
  Filled 2019-04-09: qty 4

## 2019-04-09 MED ORDER — GLYCOPYRROLATE 0.2 MG/ML IJ SOLN: 0.2000 mg | Freq: Once | INTRAMUSCULAR | Status: DC

## 2019-04-09 MED ORDER — SUCCINYLCHOLINE CHLORIDE 200 MG/10ML IV SOSY
PREFILLED_SYRINGE | INTRAVENOUS | Status: DC | PRN
  Administered 2019-04-09: 200 mg via INTRAVENOUS

## 2019-04-09 NOTE — H&P (Signed)
Kenneth Morgan is an 28 y.o. male.   Chief Complaint: improved mood HPI: major depression  Past Medical History:  Diagnosis Date  . Anxiety   . Depression   . Hypertension     History reviewed. No pertinent surgical history.  Family History  Problem Relation Age of Onset  . Drug abuse Mother   . Depression Mother   . Anxiety disorder Mother   . Bipolar disorder Mother   . Anxiety disorder Sister   . Depression Sister    Social History:  reports that he has never smoked. He has never used smokeless tobacco. He reports previous alcohol use. He reports previous drug use. Drug: Marijuana.  Allergies: No Known Allergies  (Not in a hospital admission)   Results for orders placed or performed during the hospital encounter of 04/09/19 (from the past 48 hour(s))  SARS Coronavirus 2 (CEPHEID - Performed in [facility] hospital lab), Hosp Order     Status: None   Collection Time: 04/09/19  8:33 AM   Specimen: Nasopharyngeal Swab  Result Value Ref Range   SARS Coronavirus 2 NEGATIVE NEGATIVE    Comment: (NOTE) If result is NEGATIVE SARS-CoV-2 target nucleic acids are NOT DETECTED. The SARS-CoV-2 RNA is generally detectable in upper and lower  respiratory specimens during the acute phase of infection. The lowest  concentration of SARS-CoV-2 viral copies this assay can detect is 250  copies / mL. A negative result does not preclude SARS-CoV-2 infection  and should not be used as the sole basis for treatment or other  patient management decisions.  A negative result may occur with  improper specimen collection / handling, submission of specimen other  than nasopharyngeal swab, presence of viral mutation(s) within the  areas targeted by this assay, and inadequate number of viral copies  (<250 copies / mL). A negative result must be combined with clinical  observations, patient history, and epidemiological information. If result is POSITIVE SARS-CoV-2 target nucleic acids are  DETECTED. The SARS-CoV-2 RNA is generally detectable in upper and lower  respiratory specimens dur ing the acute phase of infection.  Positive  results are indicative of active infection with SARS-CoV-2.  Clinical  correlation with patient history and other diagnostic information is  necessary to determine patient infection status.  Positive results do  not rule out bacterial infection or co-infection with other viruses. If result is PRESUMPTIVE POSTIVE SARS-CoV-2 nucleic acids MAY BE PRESENT.   A presumptive positive result was obtained on the submitted specimen  and confirmed on repeat testing.  While 2019 novel coronavirus  (SARS-CoV-2) nucleic acids may be present in the submitted sample  additional confirmatory testing may be necessary for epidemiological  and / or clinical management purposes  to differentiate between  SARS-CoV-2 and other Sarbecovirus currently known to infect humans.  If clinically indicated additional testing with an alternate test  methodology [phone]([id number]) is advised. The SARS-CoV-2 RNA is generally  detectable in upper and lower respiratory sp ecimens during the acute  phase of infection. The expected result is Negative. Fact Sheet for Patients:  BoilerBrush.com.cyhttps://www.fda.gov/media/136312/download Fact Sheet for Healthcare Providers: https://pope.com/https://www.fda.gov/media/136313/download This test is not yet approved or cleared by the [place] FDA and has been authorized for detection and/or diagnosis of SARS-CoV-2 by FDA under an Emergency Use Authorization (EUA).  This EUA will remain in effect (meaning this test can be used) for the duration of the COVID-19 declaration under Section 564(b)(1) of the Act, 21 U.S.C. section 360bbb-3(b)(1), unless the authorization is terminated  or revoked sooner. Performed at [facility], [address] [place], [place] [address]    No results found.  Review of Systems  Constitutional: Negative.   HENT: Negative.   Eyes:  Negative.   Respiratory: Negative.   Cardiovascular: Negative.   Gastrointestinal: Negative.   Musculoskeletal: Negative.   Skin: Negative.   Neurological: Negative.   Psychiatric/Behavioral: Negative.     Blood pressure (!) 148/93, pulse 86, temperature 98.3 F (36.8 C), resp. rate 18, height 5\' 10"  (1.778 m), weight 131.1 kg, SpO2 99 %. Physical Exam  Nursing note and vitals reviewed. Constitutional: He appears well-developed and well-nourished.  HENT:  Head: Normocephalic and atraumatic.  Eyes: Pupils are equal, round, and reactive to light. Conjunctivae are normal.  Neck: Normal range of motion.  Cardiovascular: Regular rhythm and normal heart sounds.  Respiratory: Effort normal.  GI: Soft.  Musculoskeletal: Normal range of motion.  Neurological: He is alert.  Skin: Skin is warm and dry.  Psychiatric: He has a normal mood and affect. His behavior is normal. Judgment and thought content normal.     Assessment/Plan Continue index  Alethia Berthold, MD 04/09/2019, 10:00 AM

## 2019-04-09 NOTE — Transfer of Care (Signed)
Immediate Anesthesia Transfer of Care Note  Patient: AKIN YI  Procedure(s) Performed: ECT TX  Patient Location: PACU  Anesthesia Type:General  Level of Consciousness: sedated  Airway & Oxygen Therapy: Patient Spontanous Breathing and Patient connected to face mask oxygen  Post-op Assessment: Report given to RN and Post -op Vital signs reviewed and stable  Post vital signs: Reviewed and stable  Last Vitals:  Vitals Value Taken Time  BP 156/96 04/09/19 1126  Temp 36.9 C 04/09/19 1126  Pulse 108 04/09/19 1127  Resp 21 04/09/19 1127  SpO2 97 % 04/09/19 1127  Vitals shown include unvalidated device data.  Last Pain:  Vitals:   04/09/19 1126  PainSc: 0-No pain         Complications: No apparent anesthesia complications

## 2019-04-09 NOTE — Anesthesia Post-op Follow-up Note (Signed)
Anesthesia QCDR form completed.        

## 2019-04-09 NOTE — Anesthesia Postprocedure Evaluation (Signed)
Anesthesia Post Note  Patient: Kenneth Morgan  Procedure(s) Performed: ECT TX  Patient location during evaluation: PACU Anesthesia Type: General Level of consciousness: awake and alert Pain management: pain level controlled Vital Signs Assessment: post-procedure vital signs reviewed and stable Respiratory status: spontaneous breathing, nonlabored ventilation, respiratory function stable and patient connected to nasal cannula oxygen Cardiovascular status: blood pressure returned to baseline and stable Postop Assessment: no apparent nausea or vomiting Anesthetic complications: no     Last Vitals:  Vitals:   04/09/19 1143 04/09/19 1217  BP:  (!) 150/90  Pulse: 98 85  Resp: 20 20  Temp:    SpO2: 95%     Last Pain:  Vitals:   04/09/19 1217  PainSc: 0-No pain                 Precious Haws Georgeanne Frankland

## 2019-04-09 NOTE — Anesthesia Preprocedure Evaluation (Signed)
Anesthesia Evaluation  Patient identified by MRN, date of birth, ID band Patient awake    Reviewed: Allergy & Precautions, H&P , NPO status , Patient's Chart, lab work & pertinent test results, reviewed documented beta blocker date and time   Airway Mallampati: II   Neck ROM: full    Dental  (+) Poor Dentition   Pulmonary neg pulmonary ROS,    Pulmonary exam normal        Cardiovascular Exercise Tolerance: Good hypertension, On Medications negative cardio ROS Normal cardiovascular exam Rhythm:regular Rate:Normal     Neuro/Psych PSYCHIATRIC DISORDERS Anxiety Depression negative neurological ROS  negative psych ROS   GI/Hepatic negative GI ROS, Neg liver ROS,   Endo/Other  Morbid obesity  Renal/GU negative Renal ROS  negative genitourinary   Musculoskeletal   Abdominal   Peds  Hematology negative hematology ROS (+)   Anesthesia Other Findings Past Medical History: No date: Anxiety No date: Depression No date: Hypertension   Reproductive/Obstetrics negative OB ROS                             Anesthesia Physical  Anesthesia Plan  ASA: III  Anesthesia Plan: General   Post-op Pain Management:    Induction: Intravenous  PONV Risk Score and Plan: TIVA  Airway Management Planned: Mask  Additional Equipment:   Intra-op Plan:   Post-operative Plan:   Informed Consent: I have reviewed the patients History and Physical, chart, labs and discussed the procedure including the risks, benefits and alternatives for the proposed anesthesia with the patient or authorized representative who has indicated his/her understanding and acceptance.     Dental Advisory Given  Plan Discussed with: Anesthesiologist, CRNA and Surgeon  Anesthesia Plan Comments: (Patient consented for risks of anesthesia including but not limited to:  - adverse reactions to medications - risk of intubation if  required - damage to teeth, lips or other oral mucosa - sore throat or hoarseness - Damage to heart, brain, lungs or loss of life  Patient voiced understanding.)        Anesthesia Quick Evaluation

## 2019-04-09 NOTE — Discharge Instructions (Signed)
1)  The drugs that you have been given will stay in your system until tomorrow so for the       next 24 hours you should not:  A. Drive an automobile  B. Make any legal decisions  C. Drink any alcoholic beverages  2)  You may resume your regular meals upon return home.  3)  A responsible adult must take you home.  Someone should stay with you for a few          hours, then be available by phone for the remainder of the treatment day.  4)  You May experience any of the following symptoms:  Headache, Nausea and a dry mouth (due to the medications you were given),  temporary memory loss and some confusion, or sore muscles (a warm bath  should help this).  If you you experience any of these symptoms let us know on                your return visit.  5)  Report any of the following: any acute discomfort, severe headache, or temperature        greater than 100.5 F.   Also report any unusual redness, swelling, drainage, or pain         at your IV site.    You may report Symptoms to:  [place] at [facility]          Phone: [phone], ECT Department           or Dr. Prescott Gum office [phone]  6)  Your next ECT Treatment is June 24   We will call 2 days prior to your scheduled appointment for arrival times.  7)  Nothing to eat or drink after midnight the night before your procedure.  8)  Take      With a sip of water the morning of your procedure.  9)  Other Instructions: Call [phone] to cancel the morning of your procedure due         to illness or emergency.  10) We will call within 72 hours to assess how you are feeling.

## 2019-04-09 NOTE — Anesthesia Procedure Notes (Signed)
Date/Time: 04/09/2019 11:15 AM Performed by: Dionne Bucy, CRNA Pre-anesthesia Checklist: Patient identified, Emergency Drugs available, Suction available and Patient being monitored Patient Re-evaluated:Patient Re-evaluated prior to induction Oxygen Delivery Method: Circle system utilized Preoxygenation: Pre-oxygenation with 100% oxygen Induction Type: IV induction Ventilation: Mask ventilation without difficulty and Mask ventilation throughout procedure Airway Equipment and Method: Bite block Placement Confirmation: positive ETCO2 Dental Injury: Teeth and Oropharynx as per pre-operative assessment

## 2019-04-12 ENCOUNTER — Telehealth: Payer: Self-pay

## 2019-04-13 ENCOUNTER — Other Ambulatory Visit: Payer: Self-pay | Admitting: Psychiatry

## 2019-04-14 ENCOUNTER — Encounter (HOSPITAL_BASED_OUTPATIENT_CLINIC_OR_DEPARTMENT_OTHER)
Admission: RE | Admit: 2019-04-14 | Discharge: 2019-04-14 | Disposition: A | Discharge status: Home / Self Care | Payer: BC Managed Care – PPO | Source: Ambulatory Visit | Attending: Psychiatry | Admitting: Psychiatry

## 2019-04-14 ENCOUNTER — Encounter: Payer: Self-pay | Admitting: Anesthesiology

## 2019-04-14 ENCOUNTER — Other Ambulatory Visit: Payer: Self-pay

## 2019-04-14 ENCOUNTER — Other Ambulatory Visit
Admission: RE | Admit: 2019-04-14 | Discharge: 2019-04-14 | Disposition: A | Discharge status: Home / Self Care | Payer: BC Managed Care – PPO | Source: Ambulatory Visit | Attending: Psychiatry | Admitting: Psychiatry

## 2019-04-14 DIAGNOSIS — F332 Major depressive disorder, recurrent severe without psychotic features: Secondary | ICD-10-CM

## 2019-04-14 DIAGNOSIS — Z1159 Encounter for screening for other viral diseases: Secondary | ICD-10-CM | POA: Diagnosis not present

## 2019-04-14 DIAGNOSIS — M791 Myalgia, unspecified site: Secondary | ICD-10-CM | POA: Diagnosis not present

## 2019-04-14 DIAGNOSIS — I10 Essential (primary) hypertension: Secondary | ICD-10-CM | POA: Diagnosis not present

## 2019-04-14 DIAGNOSIS — F419 Anxiety disorder, unspecified: Secondary | ICD-10-CM | POA: Diagnosis not present

## 2019-04-14 DIAGNOSIS — R45851 Suicidal ideations: Secondary | ICD-10-CM | POA: Diagnosis not present

## 2019-04-14 DIAGNOSIS — F329 Major depressive disorder, single episode, unspecified: Secondary | ICD-10-CM | POA: Diagnosis not present

## 2019-04-14 LAB — SARS CORONAVIRUS 2 BY RT PCR (HOSPITAL ORDER, PERFORMED IN ~~LOC~~ HOSPITAL LAB): SARS Coronavirus 2: NEGATIVE

## 2019-04-14 MED ORDER — METHOHEXITAL SODIUM 100 MG/10ML IV SOSY
PREFILLED_SYRINGE | INTRAVENOUS | Status: DC | PRN
  Administered 2019-04-14: 150 mg via INTRAVENOUS

## 2019-04-14 MED ORDER — GLYCOPYRROLATE 0.2 MG/ML IJ SOLN
0.2000 mg | Freq: Once | INTRAMUSCULAR | Status: AC
  Administered 2019-04-14: 0.2 mg via INTRAVENOUS

## 2019-04-14 MED ORDER — GLYCOPYRROLATE 0.2 MG/ML IJ SOLN
INTRAMUSCULAR | Status: AC
  Filled 2019-04-14: qty 1

## 2019-04-14 MED ORDER — MIDAZOLAM HCL 2 MG/2ML IJ SOLN
INTRAMUSCULAR | Status: AC
  Filled 2019-04-14: qty 4

## 2019-04-14 MED ORDER — MIDAZOLAM HCL 2 MG/2ML IJ SOLN
INTRAMUSCULAR | Status: DC | PRN
  Administered 2019-04-14: 4 mg via INTRAVENOUS

## 2019-04-14 MED ORDER — MIDAZOLAM HCL 2 MG/2ML IJ SOLN: 4.0000 mg | Freq: Once | INTRAMUSCULAR | Status: DC

## 2019-04-14 MED ORDER — SUCCINYLCHOLINE CHLORIDE 20 MG/ML IJ SOLN
INTRAMUSCULAR | Status: DC | PRN
  Administered 2019-04-14: 200 mg via INTRAVENOUS

## 2019-04-14 MED ORDER — SODIUM CHLORIDE 0.9 % IV SOLN
INTRAVENOUS | Status: DC | PRN
  Administered 2019-04-14: 12:00:00 via INTRAVENOUS

## 2019-04-14 MED ORDER — MIDAZOLAM HCL 2 MG/2ML IJ SOLN: 2.0000 mg | Freq: Once | INTRAMUSCULAR | Status: DC

## 2019-04-14 MED ORDER — SUCCINYLCHOLINE CHLORIDE 20 MG/ML IJ SOLN
INTRAMUSCULAR | Status: AC
  Filled 2019-04-14: qty 1

## 2019-04-14 MED ORDER — KETOROLAC TROMETHAMINE 30 MG/ML IJ SOLN
30.0000 mg | Freq: Once | INTRAMUSCULAR | Status: AC
  Administered 2019-04-14: 30 mg via INTRAVENOUS

## 2019-04-14 MED ORDER — ONDANSETRON HCL 4 MG/2ML IJ SOLN: 4.0000 mg | Freq: Once | INTRAMUSCULAR | Status: DC | PRN

## 2019-04-14 MED ORDER — SODIUM CHLORIDE 0.9 % IV SOLN
500.0000 mL | Freq: Once | INTRAVENOUS | Status: AC
  Administered 2019-04-14: 500 mL via INTRAVENOUS

## 2019-04-14 MED ORDER — KETOROLAC TROMETHAMINE 30 MG/ML IJ SOLN
INTRAMUSCULAR | Status: AC
  Filled 2019-04-14: qty 1

## 2019-04-14 MED ORDER — FENTANYL CITRATE (PF) 100 MCG/2ML IJ SOLN: 25.0000 ug | INTRAMUSCULAR | Status: DC | PRN

## 2019-04-14 NOTE — Anesthesia Preprocedure Evaluation (Signed)
Anesthesia Evaluation  Patient identified by MRN, date of birth, ID band Patient awake    Reviewed: Allergy & Precautions, NPO status , Patient's Chart, lab work & pertinent test results, reviewed documented beta blocker date and time   Airway Mallampati: III  TM Distance: >3 FB     Dental  (+) Chipped   Pulmonary           Cardiovascular hypertension, Pt. on medications      Neuro/Psych    GI/Hepatic   Endo/Other  Morbid obesity  Renal/GU      Musculoskeletal   Abdominal   Peds  Hematology   Anesthesia Other Findings   Reproductive/Obstetrics                             Anesthesia Physical Anesthesia Plan  ASA: III  Anesthesia Plan: General   Post-op Pain Management:    Induction: Intravenous  PONV Risk Score and Plan:   Airway Management Planned:   Additional Equipment:   Intra-op Plan:   Post-operative Plan:   Informed Consent: I have reviewed the patients History and Physical, chart, labs and discussed the procedure including the risks, benefits and alternatives for the proposed anesthesia with the patient or authorized representative who has indicated his/her understanding and acceptance.       Plan Discussed with: CRNA  Anesthesia Plan Comments:         Anesthesia Quick Evaluation

## 2019-04-14 NOTE — Anesthesia Post-op Follow-up Note (Signed)
Anesthesia QCDR form completed.        

## 2019-04-14 NOTE — H&P (Signed)
Kenneth Morgan is an 28 y.o. male.   Chief Complaint: Patient continues to state that he feels very sad down depressed and hopeless.  Has suicidal ideation without any specific intent or plan.  He is also starting to notice an awareness of having more short-term memory impairment HPI: History of recurrent major depression  Past Medical History:  Diagnosis Date  . Anxiety   . Depression   . Hypertension     History reviewed. No pertinent surgical history.  Family History  Problem Relation Age of Onset  . Drug abuse Mother   . Depression Mother   . Anxiety disorder Mother   . Bipolar disorder Mother   . Anxiety disorder Sister   . Depression Sister    Social History:  reports that he has never smoked. He has never used smokeless tobacco. He reports previous alcohol use. He reports previous drug use. Drug: Marijuana.  Allergies: No Known Allergies  (Not in a hospital admission)   No results found for this or any previous visit (from the past 48 hour(s)). No results found.  Review of Systems  Constitutional: Negative.   HENT: Negative.   Eyes: Negative.   Respiratory: Negative.   Cardiovascular: Negative.   Gastrointestinal: Negative.   Musculoskeletal: Negative.   Skin: Negative.   Neurological: Negative.   Psychiatric/Behavioral: Positive for depression.    Blood pressure (!) 173/97, pulse 97, temperature 98.4 F (36.9 C), temperature source Oral, resp. rate 18, SpO2 98 %. Physical Exam  Nursing note and vitals reviewed. Constitutional: He appears well-developed and well-nourished.  HENT:  Head: Normocephalic and atraumatic.  Eyes: Pupils are equal, round, and reactive to light. Conjunctivae are normal.  Neck: Normal range of motion.  Cardiovascular: Normal heart sounds.  Respiratory: Effort normal.  GI: Soft.  Musculoskeletal: Normal range of motion.  Neurological: He is alert.  Skin: Skin is warm and dry.  Psychiatric: His speech is normal. Judgment  normal. His affect is blunt. He expresses suicidal ideation. He expresses no suicidal plans. He exhibits abnormal recent memory.     Assessment/Plan We are going to switch him to bilateral treatment today.  Patient was agreeable.  We will give this at least a couple tries to see if there is a sign of any improvement  Alethia Berthold, MD 04/14/2019, 11:53 AM

## 2019-04-14 NOTE — Transfer of Care (Signed)
Immediate Anesthesia Transfer of Care Note  Patient: Kenneth Morgan  Procedure(s) Performed: ECT TX  Patient Location: PACU  Anesthesia Type:General  Level of Consciousness: sedated  Airway & Oxygen Therapy: Patient Spontanous Breathing and Patient connected to face mask oxygen  Post-op Assessment: Report given to RN, Post -op Vital signs reviewed and stable and Patient moving all extremities X 4  Post vital signs: Reviewed and stable  Last Vitals:  Vitals Value Taken Time  BP    Temp    Pulse    Resp    SpO2      Last Pain:  Vitals:   04/14/19 0856  TempSrc:   PainSc: 0-No pain         Complications: No apparent anesthesia complications

## 2019-04-14 NOTE — Procedures (Signed)
ECT SERVICES Physician's Interval Evaluation & Treatment Note  Patient Identification: ZYHEIR DAFT MRN:  [id number] Date of Evaluation:  04/14/2019 TX #: [id number]  MADRS:   MMSE:   P.E. Findings:  No change to physical exam  Psychiatric Interval Note:  Patient continues to report feeling depressed and having passive suicidal thoughts without any specific plan  Subjective:  Patient is a 28 y.o. male seen for evaluation for Electroconvulsive Therapy. Feeling subjectively depressed down and somewhat hopeless.  No suicidal plan.  No sign of psychosis.  Notices a little bit more memory impairment  Treatment Summary:   []   Right Unilateral             [x]  Bilateral   % Energy : 1.0 ms 60%   Impedance: 1460 ohms  Seizure Energy Index: 10,042 V squared  Postictal Suppression Index: 69%  Seizure Concordance Index: 97%  Medications  Pre Shock: Robinul 0.2 mg Brevital 150 mg succinylcholine 200 mg  Post Shock: Versed 4 mg  Seizure Duration: 27 seconds EMG 41 seconds EEG   Comments: Continue with seeing him on Friday for another bilateral treatment  Lungs:  [x]   Clear to auscultation               []  Other:   Heart:    [x]   Regular rhythm             []  irregular rhythm    [x]   Previous H&P reviewed, patient examined and there are NO CHANGES                 []   Previous H&P reviewed, patient examined and there are changes noted.   Alethia Berthold, MD 6/24/202011:55 AM

## 2019-04-14 NOTE — Discharge Instructions (Signed)
1)  The drugs that you have been given will stay in your system until tomorrow so for the       next 24 hours you should not:  A. Drive an automobile  B. Make any legal decisions  C. Drink any alcoholic beverages  2)  You may resume your regular meals upon return home.  3)  A responsible adult must take you home.  Someone should stay with you for a few          hours, then be available by phone for the remainder of the treatment day.  4)  You May experience any of the following symptoms:  Headache, Nausea and a dry mouth (due to the medications you were given),  temporary memory loss and some confusion, or sore muscles (a warm bath  should help this).  If you you experience any of these symptoms let us know on                your return visit.  5)  Report any of the following: any acute discomfort, severe headache, or temperature        greater than 100.5 F.   Also report any unusual redness, swelling, drainage, or pain         at your IV site.    You may report Symptoms to:  [place] at [facility]          Phone: [phone], ECT Department           or Dr. Prescott Gum office [phone]  6)  Your next ECT Treatment for June 26 at 8:30   We will call 2 days prior to your scheduled appointment for arrival times.  7)  Nothing to eat or drink after midnight the night before your procedure.  8)  Take    With a sip of water the morning of your procedure.  9)  Other Instructions: Call [phone] to cancel the morning of your procedure due         to illness or emergency.  10) We will call within 72 hours to assess how you are feeling.

## 2019-04-14 NOTE — Anesthesia Postprocedure Evaluation (Signed)
Anesthesia Post Note  Patient: Kenneth Morgan  Procedure(s) Performed: ECT TX  Patient location during evaluation: PACU Anesthesia Type: General Level of consciousness: awake and alert Pain management: pain level controlled Vital Signs Assessment: post-procedure vital signs reviewed and stable Respiratory status: spontaneous breathing, nonlabored ventilation, respiratory function stable and patient connected to nasal cannula oxygen Cardiovascular status: blood pressure returned to baseline and stable Postop Assessment: no apparent nausea or vomiting Anesthetic complications: no     Last Vitals:  Vitals:   04/14/19 1200 04/14/19 1239  BP:  (!) 137/91  Pulse:  92  Resp:  18  Temp: 36.9 C   SpO2: 100%     Last Pain:  Vitals:   04/14/19 1239  TempSrc: Oral  PainSc:                  [name]

## 2019-04-14 NOTE — Anesthesia Procedure Notes (Addendum)
Date/Time: 04/14/2019 11:44 AM Performed by: Caryl Asp, CRNA Pre-anesthesia Checklist: Patient identified, Emergency Drugs available, Suction available and Patient being monitored Patient Re-evaluated:Patient Re-evaluated prior to induction Oxygen Delivery Method: Circle system utilized Preoxygenation: Pre-oxygenation with 100% oxygen Induction Type: IV induction Ventilation: Mask ventilation without difficulty Airway Equipment and Method: Bite block Placement Confirmation: positive ETCO2 Dental Injury: Teeth and Oropharynx as per pre-operative assessment

## 2019-04-15 ENCOUNTER — Other Ambulatory Visit: Payer: Self-pay | Admitting: Psychiatry

## 2019-04-16 ENCOUNTER — Other Ambulatory Visit: Payer: Self-pay

## 2019-04-16 ENCOUNTER — Encounter (HOSPITAL_BASED_OUTPATIENT_CLINIC_OR_DEPARTMENT_OTHER)
Admission: RE | Admit: 2019-04-16 | Discharge: 2019-04-16 | Disposition: A | Discharge status: Home / Self Care | Payer: BC Managed Care – PPO | Source: Ambulatory Visit | Attending: Psychiatry | Admitting: Psychiatry

## 2019-04-16 ENCOUNTER — Other Ambulatory Visit: Payer: Self-pay | Admitting: Psychiatry

## 2019-04-16 ENCOUNTER — Encounter: Payer: Self-pay | Admitting: Anesthesiology

## 2019-04-16 DIAGNOSIS — F329 Major depressive disorder, single episode, unspecified: Secondary | ICD-10-CM | POA: Diagnosis not present

## 2019-04-16 DIAGNOSIS — F332 Major depressive disorder, recurrent severe without psychotic features: Secondary | ICD-10-CM | POA: Diagnosis not present

## 2019-04-16 DIAGNOSIS — I10 Essential (primary) hypertension: Secondary | ICD-10-CM | POA: Diagnosis not present

## 2019-04-16 DIAGNOSIS — F419 Anxiety disorder, unspecified: Secondary | ICD-10-CM | POA: Diagnosis not present

## 2019-04-16 DIAGNOSIS — Z1159 Encounter for screening for other viral diseases: Secondary | ICD-10-CM | POA: Diagnosis not present

## 2019-04-16 DIAGNOSIS — R45851 Suicidal ideations: Secondary | ICD-10-CM | POA: Diagnosis not present

## 2019-04-16 DIAGNOSIS — M791 Myalgia, unspecified site: Secondary | ICD-10-CM | POA: Diagnosis not present

## 2019-04-16 MED ORDER — MIDAZOLAM HCL 2 MG/2ML IJ SOLN
INTRAMUSCULAR | Status: AC
  Filled 2019-04-16: qty 2

## 2019-04-16 MED ORDER — MIDAZOLAM HCL 2 MG/2ML IJ SOLN
2.0000 mg | Freq: Once | INTRAMUSCULAR | Status: AC
  Administered 2019-04-16: 4 mg via INTRAVENOUS

## 2019-04-16 MED ORDER — GLYCOPYRROLATE 0.2 MG/ML IJ SOLN
0.2000 mg | Freq: Once | INTRAMUSCULAR | Status: AC
  Administered 2019-04-16: 0.2 mg via INTRAVENOUS

## 2019-04-16 MED ORDER — SODIUM CHLORIDE 0.9 % IV SOLN
500.0000 mL | Freq: Once | INTRAVENOUS | Status: AC
  Administered 2019-04-16: 500 mL via INTRAVENOUS

## 2019-04-16 MED ORDER — FENTANYL CITRATE (PF) 100 MCG/2ML IJ SOLN: 25.0000 ug | INTRAMUSCULAR | Status: DC | PRN

## 2019-04-16 MED ORDER — SUCCINYLCHOLINE CHLORIDE 20 MG/ML IJ SOLN
INTRAMUSCULAR | Status: DC | PRN
  Administered 2019-04-16: 200 mg via INTRAVENOUS

## 2019-04-16 MED ORDER — METHOHEXITAL SODIUM 100 MG/10ML IV SOSY
PREFILLED_SYRINGE | INTRAVENOUS | Status: DC | PRN
  Administered 2019-04-16: 150 mg via INTRAVENOUS

## 2019-04-16 MED ORDER — KETOROLAC TROMETHAMINE 30 MG/ML IJ SOLN
30.0000 mg | Freq: Once | INTRAMUSCULAR | Status: AC
  Administered 2019-04-16: 30 mg via INTRAVENOUS

## 2019-04-16 MED ORDER — ONDANSETRON HCL 4 MG/2ML IJ SOLN: 4.0000 mg | Freq: Once | INTRAMUSCULAR | Status: DC | PRN

## 2019-04-16 MED ORDER — GLYCOPYRROLATE 0.2 MG/ML IJ SOLN
INTRAMUSCULAR | Status: AC
  Filled 2019-04-16: qty 1

## 2019-04-16 MED ORDER — KETOROLAC TROMETHAMINE 30 MG/ML IJ SOLN
INTRAMUSCULAR | Status: AC
  Filled 2019-04-16: qty 1

## 2019-04-16 NOTE — Procedures (Signed)
ECT SERVICES Physician's Interval Evaluation & Treatment Note  Patient Identification: Kenneth Morgan MRN:  [id number] Date of Evaluation:  04/16/2019 TX #: 8  MADRS:   MMSE:   P.E. Findings:  No change to physical exam  Psychiatric Interval Note:  Patient reports that he feels slightly better.  Notices some short-term memory impairment.  Subjective:  Patient is a 28 y.o. male seen for evaluation for Electroconvulsive Therapy. Mood may be better than it was before.  Not as hopeless.  Treatment Summary:   []   Right Unilateral             [x]  Bilateral    % Energy : 1.0 ms 90%   Impedance: 1430 ohms  Seizure Energy Index: 11,418 V squared  Postictal Suppression Index: 83%  Seizure Concordance Index: 97%  Medications  Pre Shock: 150 mg Brevital 200 mg succinylcholine  Post Shock: 4 mg Versed  Seizure Duration: 26 seconds by EMG 38 seconds EEG   Comments: Increased dose of succinylcholine to 250 mg next time  Lungs:  [x]   Clear to auscultation               []  Other:   Heart:    [x]   Regular rhythm             []  irregular rhythm    [x]   Previous H&P reviewed, patient examined and there are NO CHANGES                 []   Previous H&P reviewed, patient examined and there are changes noted.   Alethia Berthold, MD 6/26/202011:45 AM

## 2019-04-16 NOTE — Discharge Instructions (Signed)
1)  The drugs that you have been given will stay in your system until tomorrow so for the       next 24 hours you should not:  A. Drive an automobile  B. Make any legal decisions  C. Drink any alcoholic beverages  2)  You may resume your regular meals upon return home.  3)  A responsible adult must take you home.  Someone should stay with you for a few          hours, then be available by phone for the remainder of the treatment day.  4)  You May experience any of the following symptoms:  Headache, Nausea and a dry mouth (due to the medications you were given),  temporary memory loss and some confusion, or sore muscles (a warm bath  should help this).  If you you experience any of these symptoms let us know on                your return visit.  5)  Report any of the following: any acute discomfort, severe headache, or temperature        greater than 100.5 F.   Also report any unusual redness, swelling, drainage, or pain         at your IV site.    You may report Symptoms to:  [place] at [facility]          Phone: [phone], ECT Department           or Dr. Prescott Gum office [phone]  6)  Your next ECT Treatment is Monday June 29 at 8:30   We will call 2 days prior to your scheduled appointment for arrival times.  7)  Nothing to eat or drink after midnight the night before your procedure.  8)  Take     With a sip of water the morning of your procedure.  9)  Other Instructions: Call [phone] to cancel the morning of your procedure due         to illness or emergency.  10) We will call within 72 hours to assess how you are feeling.

## 2019-04-16 NOTE — Anesthesia Postprocedure Evaluation (Signed)
Anesthesia Post Note  Patient: BRAINARD HIGHFILL  Procedure(s) Performed: ECT TX  Patient location during evaluation: PACU Anesthesia Type: General Level of consciousness: awake and alert Pain management: pain level controlled Vital Signs Assessment: post-procedure vital signs reviewed and stable Respiratory status: spontaneous breathing, nonlabored ventilation, respiratory function stable and patient connected to nasal cannula oxygen Cardiovascular status: blood pressure returned to baseline and stable Postop Assessment: no apparent nausea or vomiting Anesthetic complications: no     Last Vitals:  Vitals:   04/16/19 1241 04/16/19 1249  BP: (!) 153/92 (!) 153/90  Pulse: 88 88  Resp: 19 18  Temp: 36.8 C   SpO2: 98%     Last Pain:  Vitals:   04/16/19 1249  TempSrc:   PainSc: 0-No pain                 Israel Wunder S

## 2019-04-16 NOTE — H&P (Signed)
Kenneth Morgan is an 28 y.o. male.   Chief Complaint: Patient feels he may have had some improvement slightly better still has passive suicidal ideation without intent HPI: Recurrent depression that had not been responding well to unilateral now switching to bilateral  Past Medical History:  Diagnosis Date  . Anxiety   . Depression   . Hypertension     No past surgical history on file.  Family History  Problem Relation Age of Onset  . Drug abuse Mother   . Depression Mother   . Anxiety disorder Mother   . Bipolar disorder Mother   . Anxiety disorder Sister   . Depression Sister    Social History:  reports that he has never smoked. He has never used smokeless tobacco. He reports previous alcohol use. He reports previous drug use. Drug: Marijuana.  Allergies: No Known Allergies  (Not in a hospital admission)   No results found for this or any previous visit (from the past 48 hour(s)). No results found.  Review of Systems  Constitutional: Negative.   HENT: Negative.   Eyes: Negative.   Respiratory: Negative.   Cardiovascular: Negative.   Gastrointestinal: Negative.   Musculoskeletal: Negative.   Skin: Negative.   Neurological: Negative.   Psychiatric/Behavioral: Positive for depression. Negative for hallucinations, memory loss, substance abuse and suicidal ideas. The patient is not nervous/anxious and does not have insomnia.     Blood pressure (!) 146/93, pulse 78, temperature 98.5 F (36.9 C), temperature source Oral, resp. rate 18, SpO2 98 %. Physical Exam  Nursing note and vitals reviewed. Constitutional: He appears well-developed and well-nourished.  HENT:  Head: Normocephalic and atraumatic.  Eyes: Pupils are equal, round, and reactive to light. Conjunctivae are normal.  Neck: Normal range of motion.  Cardiovascular: Normal heart sounds.  Respiratory: Effort normal.  GI: Soft.  Musculoskeletal: Normal range of motion.  Neurological: He is alert.  Skin:  Skin is warm and dry.  Psychiatric: He has a normal mood and affect. His behavior is normal. Judgment and thought content normal.     Assessment/Plan Treatment today and given that he feels he may have had some improvement we will also schedule him for Monday  Alethia Berthold, MD 04/16/2019, 11:43 AM

## 2019-04-16 NOTE — Transfer of Care (Signed)
Immediate Anesthesia Transfer of Care Note  Patient: Kenneth Morgan  Procedure(s) Performed: ECT TX  Patient Location: PACU  Anesthesia Type:General  Level of Consciousness: awake and sedated  Airway & Oxygen Therapy: Patient Spontanous Breathing  Post-op Assessment: Report given to RN  Post vital signs: stable  Last Vitals:  Vitals Value Taken Time  BP    Temp    Pulse 107 04/16/19 1210  Resp 16 04/16/19 1210  SpO2 90 % 04/16/19 1210  Vitals shown include unvalidated device data.  Last Pain:  Vitals:   04/16/19 0906  TempSrc: Oral  PainSc: 0-No pain         Complications: No apparent anesthesia complications

## 2019-04-16 NOTE — Anesthesia Preprocedure Evaluation (Signed)
Anesthesia Evaluation  Patient identified by MRN, date of birth, ID band Patient awake    Reviewed: Allergy & Precautions, NPO status , Patient's Chart, lab work & pertinent test results, reviewed documented beta blocker date and time   Airway Mallampati: III  TM Distance: >3 FB     Dental  (+) Chipped   Pulmonary           Cardiovascular hypertension, Pt. on medications      Neuro/Psych PSYCHIATRIC DISORDERS Anxiety Depression    GI/Hepatic   Endo/Other    Renal/GU      Musculoskeletal   Abdominal   Peds  Hematology   Anesthesia Other Findings   Reproductive/Obstetrics                             Anesthesia Physical Anesthesia Plan  ASA: II  Anesthesia Plan: General   Post-op Pain Management:    Induction: Intravenous  PONV Risk Score and Plan:   Airway Management Planned:   Additional Equipment:   Intra-op Plan:   Post-operative Plan:   Informed Consent: I have reviewed the patients History and Physical, chart, labs and discussed the procedure including the risks, benefits and alternatives for the proposed anesthesia with the patient or authorized representative who has indicated his/her understanding and acceptance.       Plan Discussed with: CRNA  Anesthesia Plan Comments:         Anesthesia Quick Evaluation

## 2019-04-16 NOTE — Anesthesia Post-op Follow-up Note (Signed)
Anesthesia QCDR form completed.        

## 2019-04-19 ENCOUNTER — Encounter (HOSPITAL_BASED_OUTPATIENT_CLINIC_OR_DEPARTMENT_OTHER)
Admission: RE | Admit: 2019-04-19 | Discharge: 2019-04-19 | Disposition: A | Discharge status: Home / Self Care | Payer: BC Managed Care – PPO | Source: Ambulatory Visit | Attending: Psychiatry | Admitting: Psychiatry

## 2019-04-19 ENCOUNTER — Encounter: Payer: Self-pay | Admitting: Anesthesiology

## 2019-04-19 ENCOUNTER — Other Ambulatory Visit: Payer: Self-pay

## 2019-04-19 DIAGNOSIS — F332 Major depressive disorder, recurrent severe without psychotic features: Secondary | ICD-10-CM

## 2019-04-19 DIAGNOSIS — I10 Essential (primary) hypertension: Secondary | ICD-10-CM | POA: Diagnosis not present

## 2019-04-19 DIAGNOSIS — M791 Myalgia, unspecified site: Secondary | ICD-10-CM | POA: Diagnosis not present

## 2019-04-19 DIAGNOSIS — F329 Major depressive disorder, single episode, unspecified: Secondary | ICD-10-CM | POA: Diagnosis not present

## 2019-04-19 DIAGNOSIS — R45851 Suicidal ideations: Secondary | ICD-10-CM | POA: Diagnosis not present

## 2019-04-19 DIAGNOSIS — F419 Anxiety disorder, unspecified: Secondary | ICD-10-CM | POA: Diagnosis not present

## 2019-04-19 DIAGNOSIS — Z1159 Encounter for screening for other viral diseases: Secondary | ICD-10-CM | POA: Diagnosis not present

## 2019-04-19 MED ORDER — SODIUM CHLORIDE 0.9 % IV SOLN
INTRAVENOUS | Status: DC | PRN
  Administered 2019-04-19: 09:00:00 via INTRAVENOUS

## 2019-04-19 MED ORDER — MIDAZOLAM HCL 2 MG/2ML IJ SOLN: 4.0000 mg | Freq: Once | INTRAMUSCULAR | Status: DC

## 2019-04-19 MED ORDER — GLYCOPYRROLATE 0.2 MG/ML IJ SOLN
0.2000 mg | Freq: Once | INTRAMUSCULAR | Status: AC
  Administered 2019-04-19: 0.2 mg via INTRAVENOUS

## 2019-04-19 MED ORDER — ONDANSETRON HCL 4 MG/2ML IJ SOLN: 4.0000 mg | Freq: Once | INTRAMUSCULAR | Status: DC | PRN

## 2019-04-19 MED ORDER — MIDAZOLAM HCL 2 MG/2ML IJ SOLN
INTRAMUSCULAR | Status: AC
  Filled 2019-04-19: qty 4

## 2019-04-19 MED ORDER — METHOHEXITAL SODIUM 0.5 G IJ SOLR
INTRAMUSCULAR | Status: AC
  Filled 2019-04-19: qty 500

## 2019-04-19 MED ORDER — FENTANYL CITRATE (PF) 100 MCG/2ML IJ SOLN: 25.0000 ug | INTRAMUSCULAR | Status: DC | PRN

## 2019-04-19 MED ORDER — GLYCOPYRROLATE 0.2 MG/ML IJ SOLN
INTRAMUSCULAR | Status: AC
  Filled 2019-04-19: qty 1

## 2019-04-19 MED ORDER — SODIUM CHLORIDE 0.9 % IV SOLN
500.0000 mL | Freq: Once | INTRAVENOUS | Status: AC
  Administered 2019-04-19: 500 mL via INTRAVENOUS

## 2019-04-19 MED ORDER — METHOHEXITAL SODIUM 100 MG/10ML IV SOSY
PREFILLED_SYRINGE | INTRAVENOUS | Status: DC | PRN
  Administered 2019-04-19: 150 mg via INTRAVENOUS

## 2019-04-19 MED ORDER — MIDAZOLAM HCL 2 MG/2ML IJ SOLN
INTRAMUSCULAR | Status: DC | PRN
  Administered 2019-04-19: 4 mg via INTRAVENOUS

## 2019-04-19 MED ORDER — SUCCINYLCHOLINE CHLORIDE 20 MG/ML IJ SOLN
INTRAMUSCULAR | Status: AC
  Filled 2019-04-19: qty 1

## 2019-04-19 MED ORDER — SUCCINYLCHOLINE CHLORIDE 200 MG/10ML IV SOSY
PREFILLED_SYRINGE | INTRAVENOUS | Status: DC | PRN
  Administered 2019-04-19: 250 mg via INTRAVENOUS

## 2019-04-19 NOTE — Anesthesia Procedure Notes (Signed)
Date/Time: 04/19/2019 10:25 AM Performed by: Dionne Bucy, CRNA Pre-anesthesia Checklist: Patient identified, Emergency Drugs available, Suction available and Patient being monitored Patient Re-evaluated:Patient Re-evaluated prior to induction Oxygen Delivery Method: Circle system utilized Preoxygenation: Pre-oxygenation with 100% oxygen Induction Type: IV induction Ventilation: Mask ventilation without difficulty and Mask ventilation throughout procedure Airway Equipment and Method: Bite block Placement Confirmation: positive ETCO2 Dental Injury: Teeth and Oropharynx as per pre-operative assessment

## 2019-04-19 NOTE — Anesthesia Preprocedure Evaluation (Signed)
Anesthesia Evaluation  Patient identified by MRN, date of birth, ID band Patient awake    Reviewed: Allergy & Precautions, NPO status , Patient's Chart, lab work & pertinent test results, reviewed documented beta blocker date and time   Airway Mallampati: II  TM Distance: >3 FB     Dental  (+) Chipped   Pulmonary           Cardiovascular hypertension, Pt. on medications      Neuro/Psych PSYCHIATRIC DISORDERS Anxiety Depression    GI/Hepatic   Endo/Other    Renal/GU      Musculoskeletal   Abdominal   Peds  Hematology   Anesthesia Other Findings   Reproductive/Obstetrics                             Anesthesia Physical Anesthesia Plan  ASA: II  Anesthesia Plan: General   Post-op Pain Management:    Induction: Intravenous  PONV Risk Score and Plan:   Airway Management Planned:   Additional Equipment:   Intra-op Plan:   Post-operative Plan:   Informed Consent: I have reviewed the patients History and Physical, chart, labs and discussed the procedure including the risks, benefits and alternatives for the proposed anesthesia with the patient or authorized representative who has indicated his/her understanding and acceptance.     Plan Discussed with: CRNA  Anesthesia Plan Comments:         Anesthesia Quick Evaluation  

## 2019-04-19 NOTE — Anesthesia Post-op Follow-up Note (Signed)
Anesthesia QCDR form completed.        

## 2019-04-19 NOTE — Transfer of Care (Signed)
Immediate Anesthesia Transfer of Care Note  Patient: Kenneth Morgan  Procedure(s) Performed: ECT TX  Patient Location: PACU  Anesthesia Type:General  Level of Consciousness: sedated  Airway & Oxygen Therapy: Patient Spontanous Breathing and Patient connected to face mask oxygen  Post-op Assessment: Report given to RN and Post -op Vital signs reviewed and stable  Post vital signs: Reviewed and stable  Last Vitals:  Vitals Value Taken Time  BP 149/97 04/19/19 1034  Temp    Pulse 109 04/19/19 1034  Resp 14 04/19/19 1034  SpO2 100 % 04/19/19 1034  Vitals shown include unvalidated device data.  Last Pain:  Vitals:   04/19/19 0829  TempSrc: Tympanic  PainSc:          Complications: No apparent anesthesia complications

## 2019-04-19 NOTE — H&P (Signed)
Kenneth Morgan is an 28 y.o. male.   Chief Complaint: Continues to feel depressed but admits to some improvement no current suicidal ideation HPI: History of recurrent severe depression  Past Medical History:  Diagnosis Date  . Anxiety   . Depression   . Hypertension     History reviewed. No pertinent surgical history.  Family History  Problem Relation Age of Onset  . Drug abuse Mother   . Depression Mother   . Anxiety disorder Mother   . Bipolar disorder Mother   . Anxiety disorder Sister   . Depression Sister    Social History:  reports that he has never smoked. He has never used smokeless tobacco. He reports previous alcohol use. He reports previous drug use. Drug: Marijuana.  Allergies: No Known Allergies  (Not in a hospital admission)   No results found for this or any previous visit (from the past 48 hour(s)). No results found.  Review of Systems  Constitutional: Negative.   HENT: Negative.   Eyes: Negative.   Respiratory: Negative.   Cardiovascular: Negative.   Gastrointestinal: Negative.   Musculoskeletal: Negative.   Skin: Negative.   Neurological: Negative.   Psychiatric/Behavioral: Positive for depression and memory loss. Negative for hallucinations, substance abuse and suicidal ideas. The patient is not nervous/anxious and does not have insomnia.     Blood pressure (!) 140/91, pulse 92, temperature 98.7 F (37.1 C), temperature source Tympanic, resp. rate 18, SpO2 98 %. Physical Exam  Nursing note and vitals reviewed. Constitutional: He appears well-developed and well-nourished.  HENT:  Head: Normocephalic and atraumatic.  Eyes: Pupils are equal, round, and reactive to light. Conjunctivae are normal.  Neck: Normal range of motion.  Cardiovascular: Normal heart sounds.  Respiratory: Effort normal.  GI: Soft.  Musculoskeletal: Normal range of motion.  Neurological: He is alert.  Skin: Skin is warm and dry.  Psychiatric: He has a normal mood and  affect. His speech is normal and behavior is normal. Judgment and thought content normal. Cognition and memory are normal.     Assessment/Plan Showing response to ECT we will continue on Wednesday as well which will be treatment #10 and then reassess.  Alethia Berthold, MD 04/19/2019, 10:17 AM

## 2019-04-19 NOTE — Discharge Instructions (Signed)
1)  The drugs that you have been given will stay in your system until tomorrow so for the       next 24 hours you should not:  A. Drive an automobile  B. Make any legal decisions  C. Drink any alcoholic beverages  2)  You may resume your regular meals upon return home.  3)  A responsible adult must take you home.  Someone should stay with you for a few          hours, then be available by phone for the remainder of the treatment day.  4)  You May experience any of the following symptoms:  Headache, Nausea and a dry mouth (due to the medications you were given),  temporary memory loss and some confusion, or sore muscles (a warm bath  should help this).  If you you experience any of these symptoms let us know on                your return visit.  5)  Report any of the following: any acute discomfort, severe headache, or temperature        greater than 100.5 F.   Also report any unusual redness, swelling, drainage, or pain         at your IV site.    You may report Symptoms to:  [place] at [facility]          Phone: [phone], ECT Department           or Dr. Prescott Gum office [phone]  6)  Your next ECT Treatment is July 2  at 8:30   We will call 2 days prior to your scheduled appointment for arrival times.  7)  Nothing to eat or drink after midnight the night before your procedure.  8)  Take   With a sip of water the morning of your procedure.  9)  Other Instructions: Call [phone] to cancel the morning of your procedure due         to illness or emergency.  10) We will call within 72 hours to assess how you are feeling.

## 2019-04-19 NOTE — Procedures (Signed)
ECT SERVICES Physician's Interval Evaluation & Treatment Note  Patient Identification: Kenneth Morgan MRN:  [id number] Date of Evaluation:  04/19/2019 TX #: 9  MADRS:   MMSE:   P.E. Findings:  No change to physical exam  Psychiatric Interval Note:  Subjectively continues to feel better  Subjective:  Patient is a 28 y.o. male seen for evaluation for Electroconvulsive Therapy. Patient says he is feeling better has some memory impairment  Treatment Summary:   []   Right Unilateral             [x]  Bilateral   % Energy : 1.0 ms 90%   Impedance: 1300 ohms  Seizure Energy Index: 9254 V squared  Postictal Suppression Index: 73%  Seizure Concordance Index: 98%  Medications  Pre Shock: Robinul 0.2 mg Brevital 150 mg succinylcholine 200 mg  Post Shock: Versed 4 mg  Seizure Duration: 22 seconds EMG 36 seconds EEG   Comments: Follow-up Wednesday again since he is showing improvement  Lungs:  [x]   Clear to auscultation               []  Other:   Heart:    [x]   Regular rhythm             []  irregular rhythm    [x]   Previous H&P reviewed, patient examined and there are NO CHANGES                 []   Previous H&P reviewed, patient examined and there are changes noted.   Alethia Berthold, MD 6/29/202010:27 AM

## 2019-04-19 NOTE — Anesthesia Postprocedure Evaluation (Signed)
Anesthesia Post Note  Patient: Kenneth Morgan  Procedure(s) Performed: ECT TX  Patient location during evaluation: PACU Anesthesia Type: General Level of consciousness: awake and alert Pain management: pain level controlled Vital Signs Assessment: post-procedure vital signs reviewed and stable Respiratory status: spontaneous breathing, nonlabored ventilation, respiratory function stable and patient connected to nasal cannula oxygen Cardiovascular status: blood pressure returned to baseline and stable Postop Assessment: no apparent nausea or vomiting Anesthetic complications: no     Last Vitals:  Vitals:   04/19/19 1108 04/19/19 1122  BP:  130/85  Pulse:  85  Resp:  18  Temp: 36.8 C   SpO2:      Last Pain:  Vitals:   04/19/19 1122  TempSrc:   PainSc: 0-No pain                 [name]

## 2019-04-20 ENCOUNTER — Other Ambulatory Visit: Payer: Self-pay | Admitting: Psychiatry

## 2019-04-21 ENCOUNTER — Encounter: Payer: Self-pay | Admitting: Anesthesiology

## 2019-04-21 ENCOUNTER — Encounter
Admission: RE | Admit: 2019-04-21 | Discharge: 2019-04-21 | Disposition: A | Discharge status: Home / Self Care | Payer: BC Managed Care – PPO | Source: Ambulatory Visit | Attending: Psychiatry | Admitting: Psychiatry

## 2019-04-21 ENCOUNTER — Other Ambulatory Visit: Payer: Self-pay

## 2019-04-21 ENCOUNTER — Other Ambulatory Visit
Admission: RE | Admit: 2019-04-21 | Discharge: 2019-04-21 | Disposition: A | Discharge status: Home / Self Care | Payer: BC Managed Care – PPO | Source: Ambulatory Visit | Attending: Psychiatry | Admitting: Psychiatry

## 2019-04-21 DIAGNOSIS — Z01812 Encounter for preprocedural laboratory examination: Secondary | ICD-10-CM | POA: Insufficient documentation

## 2019-04-21 DIAGNOSIS — F339 Major depressive disorder, recurrent, unspecified: Secondary | ICD-10-CM | POA: Insufficient documentation

## 2019-04-21 DIAGNOSIS — F419 Anxiety disorder, unspecified: Secondary | ICD-10-CM | POA: Insufficient documentation

## 2019-04-21 DIAGNOSIS — I10 Essential (primary) hypertension: Secondary | ICD-10-CM | POA: Diagnosis not present

## 2019-04-21 DIAGNOSIS — E039 Hypothyroidism, unspecified: Secondary | ICD-10-CM | POA: Diagnosis not present

## 2019-04-21 DIAGNOSIS — Z79899 Other long term (current) drug therapy: Secondary | ICD-10-CM | POA: Diagnosis not present

## 2019-04-21 DIAGNOSIS — Z818 Family history of other mental and behavioral disorders: Secondary | ICD-10-CM | POA: Insufficient documentation

## 2019-04-21 DIAGNOSIS — F333 Major depressive disorder, recurrent, severe with psychotic symptoms: Secondary | ICD-10-CM | POA: Diagnosis not present

## 2019-04-21 DIAGNOSIS — F332 Major depressive disorder, recurrent severe without psychotic features: Secondary | ICD-10-CM

## 2019-04-21 DIAGNOSIS — Z1159 Encounter for screening for other viral diseases: Secondary | ICD-10-CM | POA: Insufficient documentation

## 2019-04-21 LAB — SARS CORONAVIRUS 2 BY RT PCR (HOSPITAL ORDER, PERFORMED IN ~~LOC~~ HOSPITAL LAB): SARS Coronavirus 2: NEGATIVE

## 2019-04-21 MED ORDER — METHOHEXITAL SODIUM 100 MG/10ML IV SOSY
PREFILLED_SYRINGE | INTRAVENOUS | Status: DC | PRN
  Administered 2019-04-21: 150 mg via INTRAVENOUS

## 2019-04-21 MED ORDER — GLYCOPYRROLATE 0.2 MG/ML IJ SOLN
INTRAMUSCULAR | Status: AC
  Administered 2019-04-21: 0.2 mg via INTRAVENOUS
  Filled 2019-04-21: qty 1

## 2019-04-21 MED ORDER — SODIUM CHLORIDE 0.9 % IV SOLN
500.0000 mL | Freq: Once | INTRAVENOUS | Status: AC
  Administered 2019-04-21: 500 mL via INTRAVENOUS

## 2019-04-21 MED ORDER — SODIUM CHLORIDE 0.9 % IV SOLN
INTRAVENOUS | Status: DC | PRN
  Administered 2019-04-21: 10:00:00 via INTRAVENOUS

## 2019-04-21 MED ORDER — ONDANSETRON HCL 4 MG/2ML IJ SOLN: 4.0000 mg | Freq: Once | INTRAMUSCULAR | Status: DC | PRN

## 2019-04-21 MED ORDER — KETOROLAC TROMETHAMINE 30 MG/ML IJ SOLN: 30.0000 mg | Freq: Once | INTRAMUSCULAR | Status: DC

## 2019-04-21 MED ORDER — MIDAZOLAM HCL 2 MG/2ML IJ SOLN
INTRAMUSCULAR | Status: AC
  Filled 2019-04-21: qty 4

## 2019-04-21 MED ORDER — MIDAZOLAM HCL 2 MG/2ML IJ SOLN
2.0000 mg | Freq: Once | INTRAMUSCULAR | Status: AC
  Administered 2019-04-21: 4 mg via INTRAVENOUS

## 2019-04-21 MED ORDER — SUCCINYLCHOLINE CHLORIDE 200 MG/10ML IV SOSY
PREFILLED_SYRINGE | INTRAVENOUS | Status: DC | PRN
  Administered 2019-04-21: 250 mg via INTRAVENOUS

## 2019-04-21 MED ORDER — GLYCOPYRROLATE 0.2 MG/ML IJ SOLN
0.2000 mg | Freq: Once | INTRAMUSCULAR | Status: AC
  Administered 2019-04-21: 0.2 mg via INTRAVENOUS

## 2019-04-21 MED ORDER — SUCCINYLCHOLINE CHLORIDE 20 MG/ML IJ SOLN
INTRAMUSCULAR | Status: AC
  Filled 2019-04-21: qty 1

## 2019-04-21 NOTE — Anesthesia Post-op Follow-up Note (Signed)
Anesthesia QCDR form completed.        

## 2019-04-21 NOTE — Discharge Instructions (Signed)
1)  The drugs that you have been given will stay in your system until tomorrow so for the       next 24 hours you should not:  A. Drive an automobile  B. Make any legal decisions  C. Drink any alcoholic beverages  2)  You may resume your regular meals upon return home.  3)  A responsible adult must take you home.  Someone should stay with you for a few          hours, then be available by phone for the remainder of the treatment day.  4)  You May experience any of the following symptoms:  Headache, Nausea and a dry mouth (due to the medications you were given),  temporary memory loss and some confusion, or sore muscles (a warm bath  should help this).  If you you experience any of these symptoms let us know on                your return visit.  5)  Report any of the following: any acute discomfort, severe headache, or temperature        greater than 100.5 F.   Also report any unusual redness, swelling, drainage, or pain         at your IV site.    You may report Symptoms to:  [place] at [facility]          Phone: [phone], ECT Department           or Dr. Prescott Gum office [phone]  6)  Your next ECT Treatment is July 15 at 8:00  We will call 2 days prior to your scheduled appointment for arrival times.  7)  Nothing to eat or drink after midnight the night before your procedure.  8)  Take      With a sip of water the morning of your procedure.  9)  Other Instructions: Call [phone] to cancel the morning of your procedure due         to illness or emergency.  10) We will call within 72 hours to assess how you are feeling.

## 2019-04-21 NOTE — Anesthesia Postprocedure Evaluation (Signed)
Anesthesia Post Note  Patient: Kenneth Morgan  Procedure(s) Performed: ECT TX  Patient location during evaluation: PACU Anesthesia Type: General Level of consciousness: sedated Pain management: pain level controlled Vital Signs Assessment: post-procedure vital signs reviewed and stable Respiratory status: spontaneous breathing and respiratory function stable Cardiovascular status: stable Anesthetic complications: no     Last Vitals:  Vitals:   04/21/19 1118 04/21/19 1121  BP:  (!) 137/96  Pulse: (!) 105 (!) 106  Resp: 19 19  Temp:    SpO2: 99% 96%    Last Pain:  Vitals:   04/21/19 1121  TempSrc:   PainSc: 0-No pain                 KEPHART,WILLIAM K

## 2019-04-21 NOTE — Transfer of Care (Signed)
Immediate Anesthesia Transfer of Care Note  Patient: LEIBY PIGEON  Procedure(s) Performed: ECT TX  Patient Location: PACU  Anesthesia Type:General  Level of Consciousness: sedated  Airway & Oxygen Therapy: Patient Spontanous Breathing and Patient connected to face mask oxygen  Post-op Assessment: Report given to RN and Post -op Vital signs reviewed and stable  Post vital signs: Reviewed and stable  Last Vitals:  Vitals Value Taken Time  BP 139/79 04/21/19 1111  Temp    Pulse 96 04/21/19 1111  Resp 14 04/21/19 1111  SpO2 98 % 04/21/19 1111  Vitals shown include unvalidated device data.  Last Pain:  Vitals:   04/21/19 0833  TempSrc: Oral  PainSc: 0-No pain         Complications: No apparent anesthesia complications and Patient re-intubated

## 2019-04-21 NOTE — H&P (Signed)
Kenneth Morgan is an 28 y.o. male.   Chief Complaint: depressioin improved but still present  HPI: chronic recurrent depression  Past Medical History:  Diagnosis Date  . Anxiety   . Depression   . Hypertension     History reviewed. No pertinent surgical history.  Family History  Problem Relation Age of Onset  . Drug abuse Mother   . Depression Mother   . Anxiety disorder Mother   . Bipolar disorder Mother   . Anxiety disorder Sister   . Depression Sister    Social History:  reports that he has never smoked. He has never used smokeless tobacco. He reports previous alcohol use. He reports previous drug use. Drug: Marijuana.  Allergies: No Known Allergies  (Not in a hospital admission)   No results found for this or any previous visit (from the past 48 hour(s)). No results found.  Review of Systems  Constitutional: Negative.   HENT: Negative.   Eyes: Negative.   Respiratory: Negative.   Cardiovascular: Negative.   Gastrointestinal: Negative.   Musculoskeletal: Negative.   Skin: Negative.   Neurological: Negative.   Psychiatric/Behavioral: Positive for depression. Negative for hallucinations, memory loss, substance abuse and suicidal ideas. The patient is not nervous/anxious and does not have insomnia.     Blood pressure 133/88, pulse 82, temperature 98.8 F (37.1 C), temperature source Oral, resp. rate 18, height 5\' 10"  (1.778 m), weight 129.3 kg, SpO2 99 %. Physical Exam  Nursing note and vitals reviewed. Constitutional: He appears well-developed and well-nourished.  HENT:  Head: Normocephalic and atraumatic.  Eyes: Pupils are equal, round, and reactive to light. Conjunctivae are normal.  Neck: Normal range of motion.  Cardiovascular: Regular rhythm and normal heart sounds.  Respiratory: Effort normal.  GI: Soft.  Musculoskeletal: Normal range of motion.  Neurological: He is alert.  Skin: Skin is warm and dry.  Psychiatric: Judgment normal. His affect is  blunt. His speech is delayed. He is slowed. Thought content is not paranoid. He expresses no homicidal and no suicidal ideation. He exhibits abnormal recent memory.     Assessment/Plan See back in 2 weeks for maintenance GET AN OUTPT PROVIDER  Alethia Berthold, MD 04/21/2019, 10:50 AM

## 2019-04-21 NOTE — Procedures (Signed)
ECT SERVICES Physician's Interval Evaluation & Treatment Note  Patient Identification: Kenneth Morgan MRN:  [id number] Date of Evaluation:  04/21/2019 TX #: 10  MADRS: 16  MMSE: 30  P.E. Findings:  No change  Psychiatric Interval Note:  Improved but not 100%  Subjective:  Patient is a 28 y.o. male seen for evaluation for Electroconvulsive Therapy. Some memory loss  Treatment Summary:   []   Right Unilateral             [x]  Bilateral   % Energy : 90 %1.3ms   Impedance: 1160 ohms  Seizure Energy Index: 10685 mv2  Postictal Suppression Index: 73%  Seizure Concordance Index: 97%  Medications  Pre Shock: rob 0.2,brev 150,suc 200  Post Shock: ver4  Seizure Duration: 19emg,35 eeg   Comments: Fu 2 week   Lungs:  [x]   Clear to auscultation               []  Other:   Heart:    [x]   Regular rhythm             []  irregular rhythm    [x]   Previous H&P reviewed, patient examined and there are NO CHANGES                 []   Previous H&P reviewed, patient examined and there are changes noted.   Alethia Berthold, MD 7/1/202010:54 AM

## 2019-04-21 NOTE — Anesthesia Preprocedure Evaluation (Signed)
Anesthesia Evaluation  Patient identified by MRN, date of birth, ID band Patient awake    Reviewed: Allergy & Precautions, NPO status , Patient's Chart, lab work & pertinent test results, reviewed documented beta blocker date and time   History of Anesthesia Complications Negative for: history of anesthetic complications  Airway Mallampati: II  TM Distance: >3 FB     Dental  (+) Chipped   Pulmonary neg sleep apnea, neg COPD,           Cardiovascular hypertension, Pt. on medications (-) Past MI and (-) CHF (-) dysrhythmias (-) Valvular Problems/Murmurs     Neuro/Psych neg Seizures PSYCHIATRIC DISORDERS Anxiety Depression    GI/Hepatic negative GI ROS, Neg liver ROS,   Endo/Other  neg diabetes  Renal/GU negative Renal ROS     Musculoskeletal   Abdominal   Peds  Hematology   Anesthesia Other Findings   Reproductive/Obstetrics                             Anesthesia Physical  Anesthesia Plan  ASA: II  Anesthesia Plan: General   Post-op Pain Management:    Induction: Intravenous  PONV Risk Score and Plan:   Airway Management Planned:   Additional Equipment:   Intra-op Plan:   Post-operative Plan:   Informed Consent: I have reviewed the patients History and Physical, chart, labs and discussed the procedure including the risks, benefits and alternatives for the proposed anesthesia with the patient or authorized representative who has indicated his/her understanding and acceptance.       Plan Discussed with: CRNA  Anesthesia Plan Comments:         Anesthesia Quick Evaluation                                  Anesthesia Evaluation  Patient identified by MRN, date of birth, ID band Patient awake    Reviewed: Allergy & Precautions, NPO status , Patient's Chart, lab work & pertinent test results, reviewed documented beta blocker date and time   Airway Mallampati:  II  TM Distance: >3 FB     Dental  (+) Chipped   Pulmonary           Cardiovascular hypertension, Pt. on medications      Neuro/Psych PSYCHIATRIC DISORDERS Anxiety Depression    GI/Hepatic   Endo/Other    Renal/GU      Musculoskeletal   Abdominal   Peds  Hematology   Anesthesia Other Findings   Reproductive/Obstetrics                             Anesthesia Physical Anesthesia Plan  ASA: II  Anesthesia Plan: General   Post-op Pain Management:    Induction: Intravenous  PONV Risk Score and Plan:   Airway Management Planned:   Additional Equipment:   Intra-op Plan:   Post-operative Plan:   Informed Consent: I have reviewed the patients History and Physical, chart, labs and discussed the procedure including the risks, benefits and alternatives for the proposed anesthesia with the patient or authorized representative who has indicated his/her understanding and acceptance.       Plan Discussed with: CRNA  Anesthesia Plan Comments:         Anesthesia Quick Evaluation

## 2019-04-21 NOTE — Anesthesia Procedure Notes (Signed)
Date/Time: 04/21/2019 11:01 AM Performed by: Dionne Bucy, CRNA Pre-anesthesia Checklist: Patient identified, Emergency Drugs available, Suction available and Patient being monitored Patient Re-evaluated:Patient Re-evaluated prior to induction Oxygen Delivery Method: Circle system utilized Preoxygenation: Pre-oxygenation with 100% oxygen Induction Type: IV induction Ventilation: Mask ventilation without difficulty and Mask ventilation throughout procedure Airway Equipment and Method: Bite block Placement Confirmation: positive ETCO2 Dental Injury: Teeth and Oropharynx as per pre-operative assessment

## 2019-04-28 DIAGNOSIS — Z20828 Contact with and (suspected) exposure to other viral communicable diseases: Secondary | ICD-10-CM | POA: Diagnosis not present

## 2019-05-03 ENCOUNTER — Telehealth: Payer: Self-pay

## 2019-05-17 ENCOUNTER — Telehealth: Payer: Self-pay

## 2019-07-16 IMAGING — CR CHEST - 2 VIEW
1 series · 2 of 2 positions shown · non-contrast
Comparison: No prior.

CLINICAL DATA: Preoperative testing.

EXAM:
CHEST - 2 VIEW

[Series 1: w chest pa · 0.14mm/px · 2 of 2 slices shown]
[im 1/2]
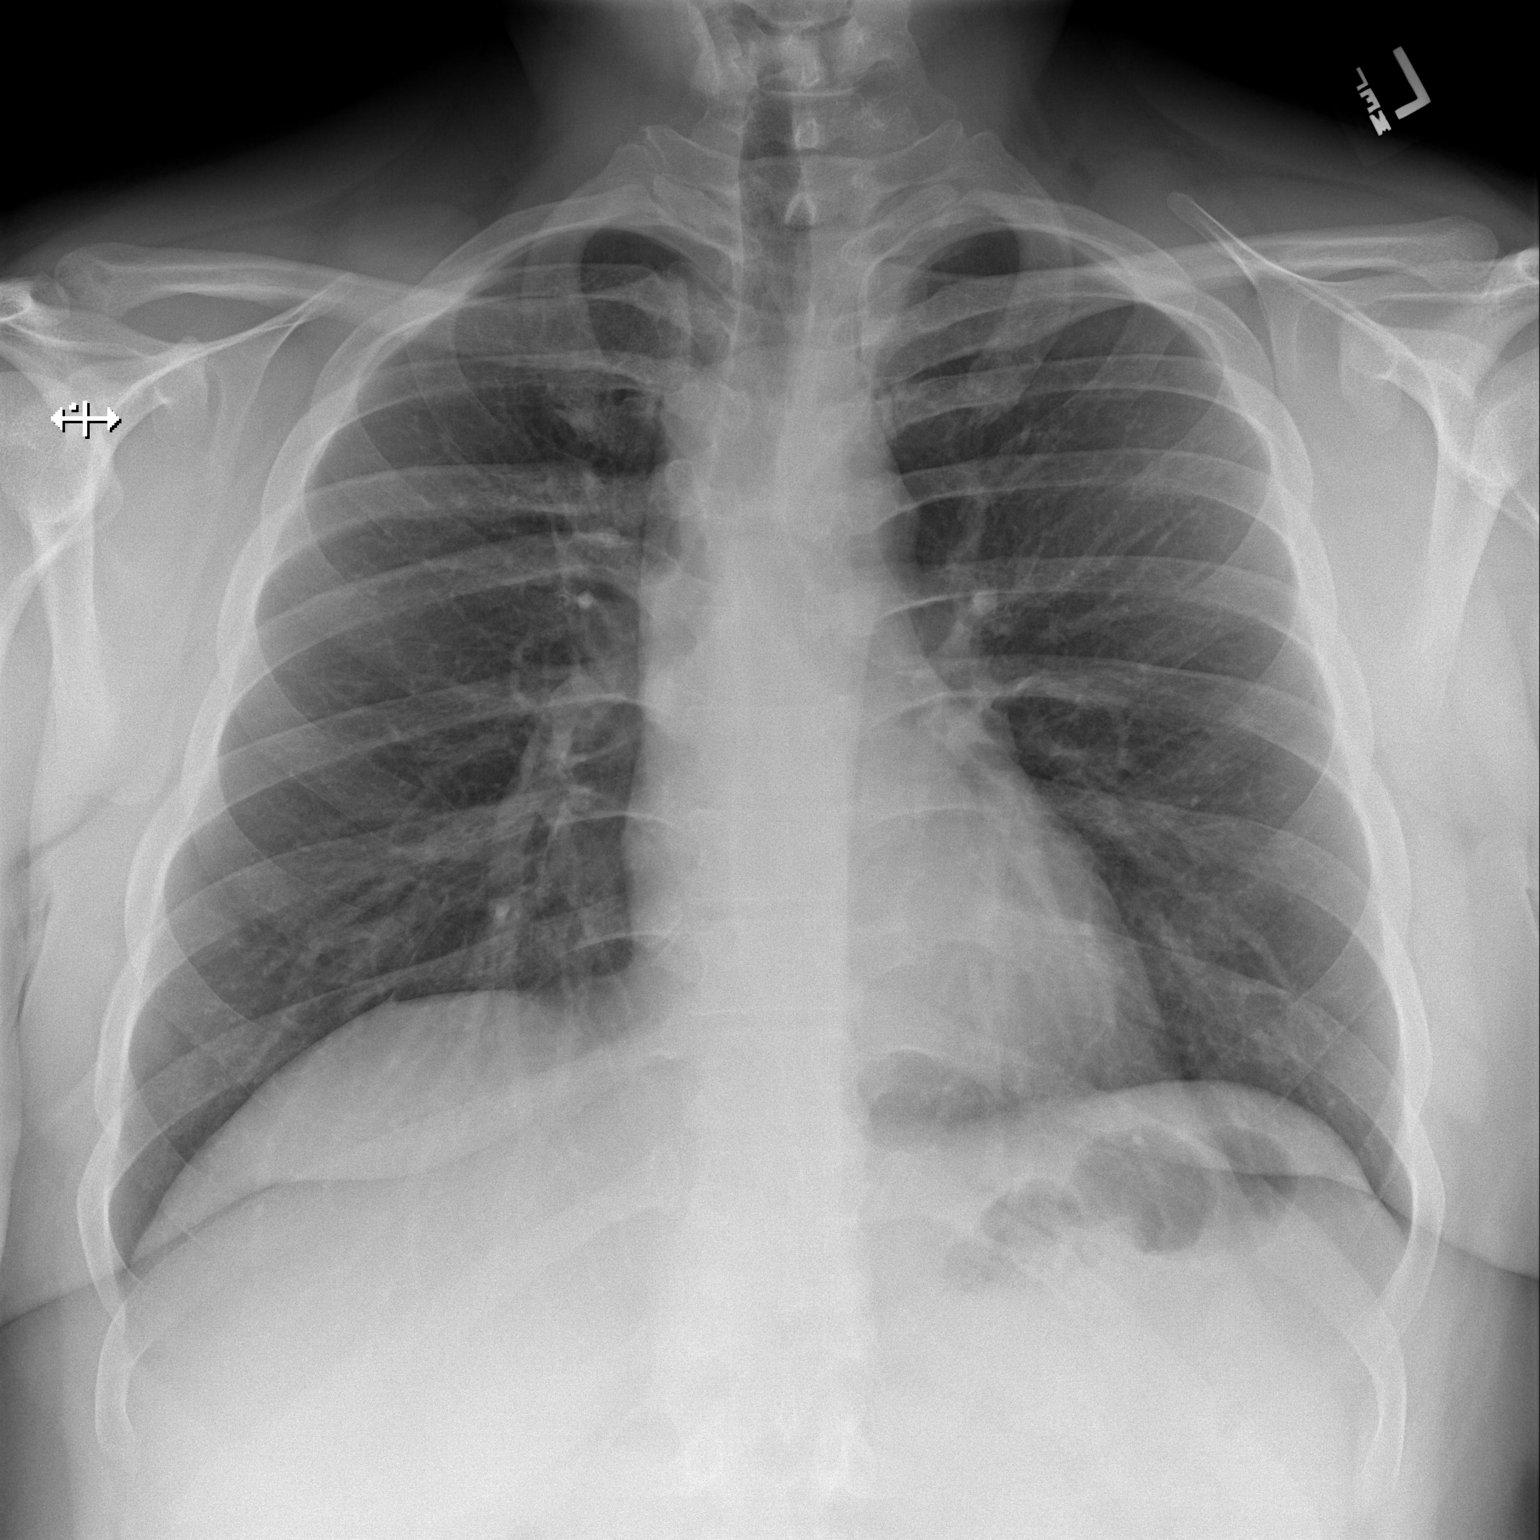
[im 2/2]
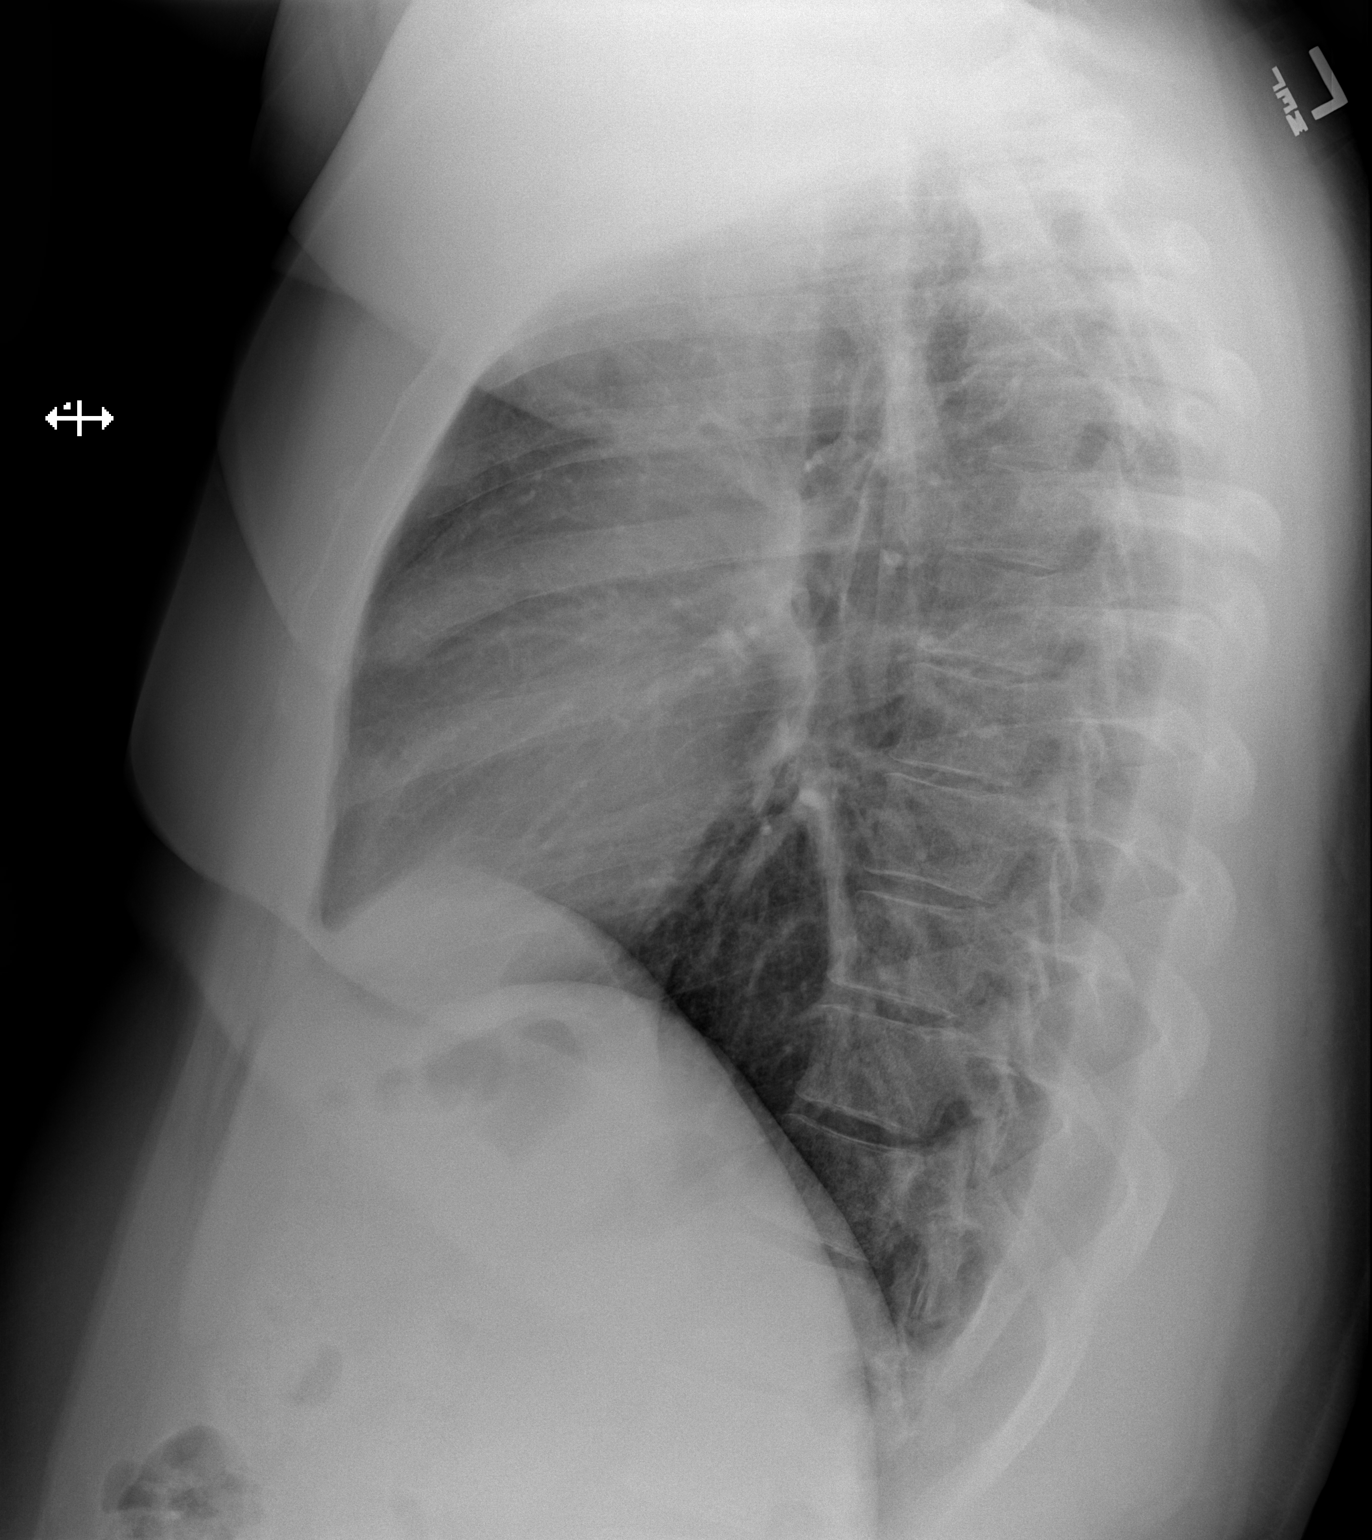

[2 of 2 positions shown; findings below may reference images not displayed]

FINDINGS: Mediastinum hilar structures normal. Lungs are clear. No pleural
effusion pneumothorax. Heart size normal. No acute bony abnormality.
IMPRESSION: Negative chest.

## 2019-09-13 DIAGNOSIS — M79641 Pain in right hand: Secondary | ICD-10-CM | POA: Diagnosis not present

## 2020-05-24 DIAGNOSIS — I10 Essential (primary) hypertension: Secondary | ICD-10-CM | POA: Diagnosis not present

## 2020-05-24 DIAGNOSIS — E039 Hypothyroidism, unspecified: Secondary | ICD-10-CM | POA: Diagnosis not present

## 2020-12-22 DIAGNOSIS — F332 Major depressive disorder, recurrent severe without psychotic features: Secondary | ICD-10-CM | POA: Diagnosis not present

## 2020-12-22 DIAGNOSIS — Z6841 Body Mass Index (BMI) 40.0 and over, adult: Secondary | ICD-10-CM | POA: Diagnosis not present

## 2020-12-22 DIAGNOSIS — I10 Essential (primary) hypertension: Secondary | ICD-10-CM | POA: Diagnosis not present

## 2020-12-22 DIAGNOSIS — E039 Hypothyroidism, unspecified: Secondary | ICD-10-CM | POA: Diagnosis not present

## 2021-04-04 DIAGNOSIS — J029 Acute pharyngitis, unspecified: Secondary | ICD-10-CM | POA: Diagnosis not present

## 2021-04-04 DIAGNOSIS — Z6841 Body Mass Index (BMI) 40.0 and over, adult: Secondary | ICD-10-CM | POA: Diagnosis not present

## 2021-04-04 DIAGNOSIS — I10 Essential (primary) hypertension: Secondary | ICD-10-CM | POA: Diagnosis not present

## 2021-04-04 DIAGNOSIS — R0981 Nasal congestion: Secondary | ICD-10-CM | POA: Diagnosis not present
